# Patient Record
Sex: Female | Born: 1970 | Race: Black or African American | Hispanic: No | Marital: Single | Smoking: Former smoker
Health system: Southern US, Community
[De-identification: ages and names within clinical notes are randomized; demographics above are authoritative.]

## PROBLEM LIST (undated history)

## (undated) DIAGNOSIS — Z6721 Type B blood, Rh negative: Secondary | ICD-10-CM

## (undated) HISTORY — DX: Type B blood, Rh negative: Z67.21

## (undated) HISTORY — PX: BREAST EXCISIONAL BIOPSY: SUR124

---
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
## 1991-06-07 HISTORY — PX: LAPAROSCOPIC OVARIAN CYSTECTOMY: SUR786

## 1991-06-07 HISTORY — PX: BREAST BIOPSY: SHX20

## 1999-08-12 ENCOUNTER — Encounter: Payer: Self-pay | Admitting: Ophthalmology

## 1999-08-12 ENCOUNTER — Ambulatory Visit (HOSPITAL_COMMUNITY): Admission: RE | Admit: 1999-08-12 | Discharge: 1999-08-12 | Payer: Self-pay | Admitting: Ophthalmology

## 2000-02-03 ENCOUNTER — Other Ambulatory Visit: Admission: RE | Admit: 2000-02-03 | Discharge: 2000-02-03 | Payer: Self-pay | Admitting: Family Medicine

## 2000-07-31 ENCOUNTER — Other Ambulatory Visit: Admission: RE | Admit: 2000-07-31 | Discharge: 2000-07-31 | Payer: Self-pay | Admitting: Gynecology

## 2001-08-13 ENCOUNTER — Other Ambulatory Visit: Admission: RE | Admit: 2001-08-13 | Discharge: 2001-08-13 | Payer: Self-pay | Admitting: Gynecology

## 2003-02-26 ENCOUNTER — Other Ambulatory Visit: Admission: RE | Admit: 2003-02-26 | Discharge: 2003-02-26 | Payer: Self-pay | Admitting: Gynecology

## 2003-02-27 ENCOUNTER — Inpatient Hospital Stay (HOSPITAL_COMMUNITY): Admission: AD | Admit: 2003-02-27 | Discharge: 2003-03-02 | Payer: Self-pay | Admitting: Gynecology

## 2003-02-27 ENCOUNTER — Encounter (INDEPENDENT_AMBULATORY_CARE_PROVIDER_SITE_OTHER): Payer: Self-pay | Admitting: *Deleted

## 2003-04-03 ENCOUNTER — Encounter: Admission: RE | Admit: 2003-04-03 | Discharge: 2003-07-02 | Payer: Self-pay | Admitting: Gynecology

## 2004-12-26 ENCOUNTER — Emergency Department (HOSPITAL_COMMUNITY): Admission: EM | Admit: 2004-12-26 | Discharge: 2004-12-26 | Payer: Self-pay | Admitting: Emergency Medicine

## 2005-09-26 ENCOUNTER — Other Ambulatory Visit: Admission: RE | Admit: 2005-09-26 | Discharge: 2005-09-26 | Payer: Self-pay | Admitting: Gynecology

## 2006-09-28 ENCOUNTER — Other Ambulatory Visit: Admission: RE | Admit: 2006-09-28 | Discharge: 2006-09-28 | Payer: Self-pay | Admitting: Gynecology

## 2007-10-03 ENCOUNTER — Other Ambulatory Visit: Admission: RE | Admit: 2007-10-03 | Discharge: 2007-10-03 | Payer: Self-pay | Admitting: Gynecology

## 2008-05-16 ENCOUNTER — Emergency Department (HOSPITAL_COMMUNITY): Admission: EM | Admit: 2008-05-16 | Discharge: 2008-05-16 | Payer: Self-pay | Admitting: Emergency Medicine

## 2008-09-08 ENCOUNTER — Encounter: Payer: Self-pay | Admitting: Women's Health

## 2008-09-08 ENCOUNTER — Ambulatory Visit: Payer: Self-pay | Admitting: Women's Health

## 2008-09-08 ENCOUNTER — Other Ambulatory Visit: Admission: RE | Admit: 2008-09-08 | Discharge: 2008-09-08 | Payer: Self-pay | Admitting: Gynecology

## 2009-03-01 ENCOUNTER — Emergency Department (HOSPITAL_COMMUNITY): Admission: EM | Admit: 2009-03-01 | Discharge: 2009-03-01 | Payer: Self-pay | Admitting: Emergency Medicine

## 2009-11-09 ENCOUNTER — Other Ambulatory Visit: Admission: RE | Admit: 2009-11-09 | Discharge: 2009-11-09 | Payer: Self-pay | Admitting: Gynecology

## 2009-11-09 ENCOUNTER — Ambulatory Visit: Payer: Self-pay | Admitting: Women's Health

## 2010-03-08 ENCOUNTER — Ambulatory Visit: Payer: Self-pay | Admitting: Women's Health

## 2010-10-22 NOTE — Discharge Summary (Signed)
   [name], [name]                          ACCOUNT NO.:  [id number]   MEDICAL RECORD NO.:  [id number]                   PATIENT TYPE:  INP   LOCATION:  9307                                 [facility]ILITY:  [facility]   PHYSICIAN:  Juan H. Lily Peer, M.D.             DATE OF BIRTH:  Mar 29, 1971   DATE OF ADMISSION:  02/27/2003  DATE OF DISCHARGE:  03/02/2003                                 DISCHARGE SUMMARY   DISCHARGE DIAGNOSIS:  Ruptured ectopic, at approximately 9-2/7 weeks by  dates.   PROCEDURES:  1. Exploratory laparotomy.  2. Left salpingectomy.  3. Evacuation of hemoperitoneum.  4. Lysis of right pelvic adhesions.   HISTORY OF PRESENT ILLNESS:  A 40 year old gravida 1, para 0, with known LMP  December 22, 2002; with an [facility] of September 29, 2003.  She was at 9 weeks and 3 days  and presented to the office complaining of intense lower abdominal pain,  with no bleeding.  She could not stand upright at the office and was in  acute distress.  Ultrasound confirmed ectopic and she was admitted to the  hospital for surgical removal.   LABORATORY:  The patient is B negative.  Other prenatal laboratories were  not available.   HOSPITAL COURSE AND TREATMENT:  The patient presented on February 27, 2003  to the office with acute pain.  She was admitted to the hospital for an  ectopic pregnancy.  She had surgical removal of the ectopic pregnancy from  the left fallopian tube with an exploratory laparotomy.  Postoperatively she  did well.  She received RhoGAM, was able to void.  Vital signs stable.  She  was discharged to home in satisfactory condition on her postoperative day  #3.   DISCHARGE LABORATORIES:  White count 9.9, hemoglobin 10.6, hematocrit 31.2,  platelets 188,000.   DISPOSITION:  She was discharged to home with instructions to follow up in  two weeks at the office, or as needed.  Prescription for Tylox was given.  She is instructed to take iron and prenatal vitamins daily.     Susa Loffler, P.A.                    Juan H. Lily Peer, M.D.    TSG/MEDQ  D:  03/31/2003  T:  03/31/2003  Job:  [id number]

## 2010-10-22 NOTE — H&P (Signed)
NAMEHAYLA, [name]                          ACCOUNT NO.:  [id number]   MEDICAL RECORD NO.:  [id number]                   PATIENT TYPE:  MAT   LOCATION:  MATC                                 [facility]   PHYSICIAN:  Ivor Costa. Farrel Gobble, M.D.              DATE OF BIRTH:  1971-02-12   DATE OF ADMISSION:  02/27/2003  DATE OF DISCHARGE:                                HISTORY & PHYSICAL   CHIEF COMPLAINT:  Ruptured ectopic.   HISTORY OF PRESENT ILLNESS:  The patient is a 40 year old, G1, P0, with an  LMP of December 22, 2002, and an estimated date of confinement of September 29, 2003, who was at 9 weeks and 3 days.  She presented to the office today  complaining of intense lower abdominal pain, menstrual-like in nature.  No  vaginal bleeding.  The patient states that she felt the strong urge to have  a bowel movement today and shortly after pushing to have a bowel movement  she developed this pain.  She presented to the office.  She could not stand  upright.  She was in acute distress.  The patient was without any other  complaints.  Of note, she had her first prenatal visit yesterday.  The  patient denies any shortness of breath or syncope.   PAST OBSTETRICAL/GYNECOLOGICAL HISTORY:  The patient has regular menses.  The patient has a history of endometriosis for which she had a laparotomy in  1994.  She did not conceive on birth control pill.   PAST MEDICAL HISTORY:  Negative.   PAST SURGICAL HISTORY:  The patient had an appendectomy, cystectomy, and  exploratory laparotomy as two separate surgeries in 1994 and 1989,  respectively.   MEDICATIONS:  Prenatal vitamins.   ALLERGIES:  None.   SOCIAL HISTORY:  She smokes approximately one pack per day.  No alcohol or  caffeine.   PHYSICAL EXAMINATION:  GENERAL APPEARANCE:  She is an [place] American  female in moderate distress.  VITAL SIGNS:  Her blood pressure is 124/72.  HEART:  Regular rate.  LUNGS:  Clear to auscultation.  ABDOMEN:  Acutely tender.  PELVIC:  On speculum exam, there was some discharge in the vault.  Bimanual  examine was unable to be accomplished because of the patient's extreme  tenderness.  The patient then had a transvaginal ultrasound done in our  office, which she was unable to tolerate.  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  The endometrium was echogenic.  No intrauterine pregnancy was  seen.  The left adnexa had a 7 x 4 cm, thick-walled, cystic solid mass with  increased vascular flow.  There was a moderate amount of echogenic fluid in  the cul-de-sac that measured 5 x 3 cm.    ASSESSMENT:  Ruptured ectopic.  The patient will be transferred from  the  office to the OR for an exploratory laparotomy with a salpingectomy.  This  was explained to the patient and her fiancee.  All questions were addressed.                                               Ivor Costa. Farrel Gobble, M.D.    THL/MEDQ  D:  02/27/2003  T:  02/27/2003  Job:  [id number]

## 2010-10-22 NOTE — Op Note (Signed)
NAMEAREYA, LEMMERMAN                          ACCOUNT NO.:  [id number]   MEDICAL RECORD NO.:  [id number]                   PATIENT TYPE:  AMB   LOCATION:  SDC                                  [facility]  [facility]   PHYSICIAN:  Ivor Costa. Farrel Gobble, M.D.              DATE OF BIRTH:  02/18/1971   DATE OF PROCEDURE:  02/27/2003  DATE OF DISCHARGE:                                 OPERATIVE REPORT   PREOPERATIVE DIAGNOSES:  1. Ruptured ectopic.  2. Hemoperitoneum.   POSTOPERATIVE DIAGNOSES:  1. Ruptured ectopic.  2. Hemoperitoneum.   PROCEDURES:  1. Exploratory laparotomy.  2. Left salpingectomy.  3. Evacuation of hemoperitoneum.  4. Lysis of right pelvic adhesions.   SURGEON:  Ivor Costa. Farrel Gobble, M.D.   ANESTHESIA:  General.   FLUIDS REPLACED:  1200 mL of lactated Ringer's.   ESTIMATED BLOOD LOSS:  200 mL of clotted and 100 mL of nonclotted blood.   URINE OUTPUT:  300 mL clear urine.   FINDINGS:  There was a tortuous tube, markedly adherent to the cul-de-sac  and posterior uterus.  The left ovary was surgically absent.  The right tube  and ovary were adherent with filmy adhesions, which were lysed.  The right  ovary had evidence of corpus luteum cyst.   COMPLICATIONS:  None.   PATHOLOGY:  Left tube with ectopic.   DESCRIPTION OF PROCEDURE:  The patient was taken to the operating room and  general anesthesia was induced, placed in the dorsal lithotomy position,  prepped and draped in the usual sterile fashion with the legs in the frogleg  position.  A bivalve speculum was placed in the vagina.  The cervix was  visualized and a uterine manipulator was placed in order to help [place] with  mobility of uterus during the procedure.  The patient was prepped and draped  in the usual sterile fashion.  A Pfannenstiel skin incision was made with  the scalpel through the previous exploratory laparotomy site and carried  through the underlying layer of fascia with electrocautery.  The fascia  was  scored in the midline and the incision was extended laterally with the Mayo  scissors.  The inferior aspect of the fascial incision was grasped with  Kochers, underlying rectus muscles were dissected off by blunt and sharp  dissection.  In a similar fashion the superior aspect of the incision was  grasped with Kochers, the underlying rectus muscles were dissected off.  The  rectus muscles were separated in the midline.  The peritoneum was noted to  be markedly edematous and was eventually able to be entered bluntly, after  which a marked amount of clot was obtained.  After the clot was mostly  excised, we were able to extend the incision superiorly with careful  attention to underlying bowel.  The inferior peritoneum was gently dissected  down as it was markedly edematous and difficult to visualize.  The  pelvis  was inspected.  The ectopic on the left was able to be palpated but at this  point not yet visualized.  The right adnexa was also noted to have some  filmy adhesions in the posterior cul-de-sac, and these were bluntly  dissected off.  An O'Connor-O'Sullivan retractor was then placed in the  incision, moist lap packs were placed.  The round ligament was grasped  bilaterally and the uterus was able to be better rotated at that point.  A  tortuous structure which was later identified as the tube was identified and  markedly adherent to the posterior cul-de-sac and uterus.  The adhesions  were bluntly dissected off until this could be better visualized and brought  to the incision.  A Kelly clamp was then placed across the midportion of  this tube, but at this point we had not identified the ovary.  Prior to  dissecting off the remainder of the tube, we extensively looked for the  ovary.  We were aware that the patient had had a cystectomy in the past for  an endometrioma.  After multiple attempts to find the ovary, we were  unsuccessful.  The IP was clamped with the fimbriated  edge of the tube and  the specimen was sharply dissected off.  The mesosalpinx was then closed  with Heaney suture of 0 Vicryl, and it was noted to be hemostatic  afterwards.  Inspection of the adnexa confirmed the absence of the ovary.  Inspection of the pelvis also confirmed the absence of the ovary.  The IP  was clearly identified.  The pelvis was then irrigated with copious amounts  of warm saline.  Hemostasis on the left side was assured.  Inspection of the  right tube and ovary then ensued.  The fimbriated end was noted to be  edematous secondary to the hemoperitoneum, however not grossly obstructed.  The right ovary and tube were gently lysed and mobile at the end of the  case.  The retractor and sponges were then removed.  The pelvis was  irrigated.  Hemostasis of the fascia and muscle was assured.  The fascia was  closed with 0 Vicryl in a running fashion, the subcu was irrigated, and the  skin was closed with 4-0 Vicryl on a [place].  The patient tolerated the  procedure well.  Sponge, lap, and needle counts were correct x2.  She was  transferred to the PACU in stable condition.  She received Ancef  intraoperatively.                                               Ivor Costa. Farrel Gobble, M.D.    THL/MEDQ  D:  02/27/2003  T:  02/28/2003  Job:  [id number]

## 2010-11-11 ENCOUNTER — Encounter: Payer: Self-pay | Admitting: Women's Health

## 2010-11-24 ENCOUNTER — Other Ambulatory Visit: Payer: Self-pay | Admitting: Women's Health

## 2010-11-24 ENCOUNTER — Other Ambulatory Visit (HOSPITAL_COMMUNITY)
Admission: RE | Admit: 2010-11-24 | Discharge: 2010-11-24 | Disposition: A | Discharge status: Home / Self Care | Payer: 59 | Source: Ambulatory Visit | Attending: Gynecology | Admitting: Gynecology

## 2010-11-24 ENCOUNTER — Encounter (INDEPENDENT_AMBULATORY_CARE_PROVIDER_SITE_OTHER): Payer: 59 | Admitting: Women's Health

## 2010-11-24 DIAGNOSIS — R809 Proteinuria, unspecified: Secondary | ICD-10-CM

## 2010-11-24 DIAGNOSIS — Z124 Encounter for screening for malignant neoplasm of cervix: Secondary | ICD-10-CM | POA: Insufficient documentation

## 2010-11-24 DIAGNOSIS — Z01419 Encounter for gynecological examination (general) (routine) without abnormal findings: Secondary | ICD-10-CM

## 2010-11-24 DIAGNOSIS — Z13220 Encounter for screening for lipoid disorders: Secondary | ICD-10-CM

## 2010-12-01 ENCOUNTER — Other Ambulatory Visit: Payer: 59

## 2010-12-01 ENCOUNTER — Ambulatory Visit (INDEPENDENT_AMBULATORY_CARE_PROVIDER_SITE_OTHER): Payer: 59 | Admitting: Women's Health

## 2010-12-01 DIAGNOSIS — N831 Corpus luteum cyst of ovary, unspecified side: Secondary | ICD-10-CM

## 2010-12-01 DIAGNOSIS — N946 Dysmenorrhea, unspecified: Secondary | ICD-10-CM

## 2010-12-01 DIAGNOSIS — IMO0002 Reserved for concepts with insufficient information to code with codable children: Secondary | ICD-10-CM

## 2010-12-01 DIAGNOSIS — N920 Excessive and frequent menstruation with regular cycle: Secondary | ICD-10-CM

## 2011-12-22 ENCOUNTER — Encounter: Payer: 59 | Admitting: Women's Health

## 2011-12-29 ENCOUNTER — Ambulatory Visit (INDEPENDENT_AMBULATORY_CARE_PROVIDER_SITE_OTHER): Payer: 59 | Admitting: Women's Health

## 2011-12-29 ENCOUNTER — Encounter: Payer: Self-pay | Admitting: Women's Health

## 2011-12-29 VITALS — BP 128/80 | Ht 64.5 in | Wt 228.0 lb

## 2011-12-29 DIAGNOSIS — E079 Disorder of thyroid, unspecified: Secondary | ICD-10-CM

## 2011-12-29 DIAGNOSIS — Z01419 Encounter for gynecological examination (general) (routine) without abnormal findings: Secondary | ICD-10-CM

## 2011-12-29 DIAGNOSIS — Z13220 Encounter for screening for lipoid disorders: Secondary | ICD-10-CM

## 2011-12-29 DIAGNOSIS — D219 Benign neoplasm of connective and other soft tissue, unspecified: Secondary | ICD-10-CM | POA: Insufficient documentation

## 2011-12-29 DIAGNOSIS — D259 Leiomyoma of uterus, unspecified: Secondary | ICD-10-CM

## 2011-12-29 DIAGNOSIS — E119 Type 2 diabetes mellitus without complications: Secondary | ICD-10-CM

## 2011-12-29 LAB — LIPID PANEL
Cholesterol: 145 mg/dL (ref 0–200)
HDL: 41 mg/dL (ref >39)
LDL Cholesterol: 90 mg/dL (ref 0–99)
Total CHOL/HDL Ratio: 3.5 Ratio
Triglycerides: 69 mg/dL (ref <150)
VLDL: 14 mg/dL (ref 0–40)

## 2011-12-29 LAB — CBC WITH DIFFERENTIAL/PLATELET
Basophils Absolute: 0 10*3/uL (ref 0.0–0.1)
Basophils Relative: 0 % (ref 0–1)
Eosinophils Absolute: 0.1 10*3/uL (ref 0.0–0.7)
Eosinophils Relative: 1 % (ref 0–5)
HCT: 40.1 % (ref 36.0–46.0)
Hemoglobin: 13.6 g/dL (ref 12.0–15.0)
Lymphocytes Relative: 32 % (ref 12–46)
Lymphs Abs: 1.5 10*3/uL (ref 0.7–4.0)
MCH: 29.9 pg (ref 26.0–34.0)
MCHC: 33.9 g/dL (ref 30.0–36.0)
MCV: 88.1 fL (ref 78.0–100.0)
Monocytes Absolute: 0.3 10*3/uL (ref 0.1–1.0)
Monocytes Relative: 6 % (ref 3–12)
Neutro Abs: 2.8 10*3/uL (ref 1.7–7.7)
Neutrophils Relative %: 61 % (ref 43–77)
Platelets: 205 10*3/uL (ref 150–400)
RBC: 4.55 MIL/uL (ref 3.87–5.11)
RDW: 13.5 % (ref 11.5–15.5)
WBC: 4.7 10*3/uL (ref 4.0–10.5)

## 2011-12-29 LAB — TSH: TSH: 1.574 u[IU]/mL (ref 0.350–4.500)

## 2011-12-29 LAB — GLUCOSE, RANDOM: Glucose, Bld: 116 mg/dL — ABNORMAL HIGH (ref 70–99)

## 2011-12-29 LAB — HEMOGLOBIN A1C
Hgb A1c MFr Bld: 6.3 % — ABNORMAL HIGH (ref <5.7)
Mean Plasma Glucose: 134 mg/dL — ABNORMAL HIGH (ref <117)

## 2011-12-29 NOTE — Patient Instructions (Addendum)

## 2011-12-29 NOTE — Progress Notes (Signed)
[name] 1970-12-06 [id number]    History:    The patient presents for annual exam.  Monthly cycles lasting 3-5 days/not sexually active/not desiring conception. Breast Biopsy 93-benign/Mammogram for suspicious lump 08-normal. Hx normal Paps. Hx fibroids-asymptomatic. Hx type II DM-diet controlled, has seen Dr. Lisabeth Devoid the past...    Past medical history, past surgical history, family history and social history were all reviewed and documented in the EPIC chart. Works at [employer].    ROS:  A  ROS was performed and pertinent positives and negatives are included in the history.  Exam:  Filed Vitals:   12/29/11 0819  BP: 128/80    General appearance:  Normal Head/Neck:  Normal, without cervical or supraclavicular adenopathy. Thyroid:  Symmetrical, normal in size, without palpable masses or nodularity. Respiratory  Effort:  Normal  Auscultation:  Clear without wheezing or rhonchi Cardiovascular  Auscultation:  Regular rate, without rubs, murmurs or gallops  Edema/varicosities:  Not grossly evident Abdominal  Soft,nontender, without masses, guarding or rebound.  Liver/spleen:  No organomegaly noted  Hernia:  None appreciated  Skin  Inspection:  Grossly normal  Palpation:  Grossly normal Neurologic/psychiatric  Orientation:  Normal with appropriate conversation.  Mood/affect:  Normal  Genitourinary    Breasts: Examined lying and sitting.     Right: Without masses, retractions, discharge or axillary adenopathy.     Left: Without masses, retractions, discharge or axillary adenopathy.   Inguinal/mons:  Normal without inguinal adenopathy  External genitalia:  Normal  BUS/Urethra/Skene's glands:  Normal  Bladder:  Normal  Vagina:  Normal  Cervix:  Normal  Uterus:   Bulky in size, shape and contour.  Midline and mobile  Adnexa/parametria:     Rt: Without masses or tenderness.   Lt: Without masses or tenderness.  Anus and perineum: Normal  Digital rectal exam: Normal  sphincter tone without palpated masses or tenderness  Assessment/Plan:  41 y.o.  SBF G1P0 for annual exam with no complaints.  Normal gyn exam/ fibroids Hx type II DM-diet controlled Obesity  Plan: U/A, CBC, TSH, Hbg AIC, lipid panel, Glu. No Pap, new screening guidelines reviewed. Instructed to scheduled mammogram and continue with yearly mammograms. SBE's, Ca rich diet, Vit. D 1000 daily, exercise, cutting calories & carbs for weight loss encouraged. Risk factors associated with obesity reviewed. Condoms encouraged if become sexually active.    Harrington Challenger [facility], 8:52 AM 12/29/2011

## 2011-12-30 LAB — URINALYSIS W MICROSCOPIC + REFLEX CULTURE
Bacteria, UA: NONE SEEN
Bilirubin Urine: NEGATIVE
Casts: NONE SEEN
Crystals: NONE SEEN
Glucose, UA: NEGATIVE mg/dL
Hgb urine dipstick: NEGATIVE
Ketones, ur: NEGATIVE mg/dL
Leukocytes, UA: NEGATIVE
Nitrite: NEGATIVE
Protein, ur: NEGATIVE mg/dL
Specific Gravity, Urine: 1.024 (ref 1.005–1.030)
Squamous Epithelial / HPF: NONE SEEN
Urobilinogen, UA: 0.2 mg/dL (ref 0.0–1.0)
pH: 5 (ref 5.0–8.0)

## 2012-02-16 ENCOUNTER — Other Ambulatory Visit: Payer: Self-pay | Admitting: Women's Health

## 2012-02-16 DIAGNOSIS — Z1231 Encounter for screening mammogram for malignant neoplasm of breast: Secondary | ICD-10-CM

## 2012-03-06 ENCOUNTER — Ambulatory Visit (HOSPITAL_COMMUNITY)
Admission: RE | Admit: 2012-03-06 | Discharge: 2012-03-06 | Disposition: A | Discharge status: Home / Self Care | Payer: 59 | Source: Ambulatory Visit | Attending: Women's Health | Admitting: Women's Health

## 2012-03-06 DIAGNOSIS — Z1231 Encounter for screening mammogram for malignant neoplasm of breast: Secondary | ICD-10-CM | POA: Insufficient documentation

## 2012-03-08 ENCOUNTER — Other Ambulatory Visit: Payer: Self-pay | Admitting: Women's Health

## 2012-03-08 DIAGNOSIS — R928 Other abnormal and inconclusive findings on diagnostic imaging of breast: Secondary | ICD-10-CM

## 2012-03-09 ENCOUNTER — Other Ambulatory Visit: Payer: Self-pay | Admitting: *Deleted

## 2012-03-09 DIAGNOSIS — N63 Unspecified lump in unspecified breast: Secondary | ICD-10-CM

## 2012-03-13 ENCOUNTER — Ambulatory Visit
Admission: RE | Admit: 2012-03-13 | Discharge: 2012-03-13 | Disposition: A | Discharge status: Home / Self Care | Payer: 59 | Source: Ambulatory Visit | Attending: Women's Health | Admitting: Women's Health

## 2012-03-13 DIAGNOSIS — R928 Other abnormal and inconclusive findings on diagnostic imaging of breast: Secondary | ICD-10-CM

## 2012-03-30 ENCOUNTER — Other Ambulatory Visit: Payer: Self-pay | Admitting: *Deleted

## 2012-03-30 DIAGNOSIS — N63 Unspecified lump in unspecified breast: Secondary | ICD-10-CM

## 2012-04-05 ENCOUNTER — Telehealth: Payer: Self-pay | Admitting: Women's Health

## 2012-04-05 NOTE — Telephone Encounter (Signed)
Telephone call for followup. Had a mammogram that described breast is extremely dense, questionable nodule with normal ultrasound. No further followup was recommended other than annual mammogram and continue SBE's and report changes.

## 2012-04-30 ENCOUNTER — Other Ambulatory Visit: Payer: Self-pay | Admitting: *Deleted

## 2012-04-30 DIAGNOSIS — N63 Unspecified lump in unspecified breast: Secondary | ICD-10-CM

## 2012-12-31 ENCOUNTER — Ambulatory Visit (INDEPENDENT_AMBULATORY_CARE_PROVIDER_SITE_OTHER): Payer: 59 | Admitting: Women's Health

## 2012-12-31 ENCOUNTER — Other Ambulatory Visit (HOSPITAL_COMMUNITY)
Admission: RE | Admit: 2012-12-31 | Discharge: 2012-12-31 | Disposition: A | Discharge status: Home / Self Care | Payer: 59 | Source: Ambulatory Visit | Attending: Gynecology | Admitting: Gynecology

## 2012-12-31 ENCOUNTER — Encounter: Payer: Self-pay | Admitting: Women's Health

## 2012-12-31 VITALS — BP 110/70 | Ht 65.0 in | Wt 221.0 lb

## 2012-12-31 DIAGNOSIS — Z13220 Encounter for screening for lipoid disorders: Secondary | ICD-10-CM

## 2012-12-31 DIAGNOSIS — Z01419 Encounter for gynecological examination (general) (routine) without abnormal findings: Secondary | ICD-10-CM

## 2012-12-31 DIAGNOSIS — Z833 Family history of diabetes mellitus: Secondary | ICD-10-CM

## 2012-12-31 LAB — LIPID PANEL
HDL: 35 mg/dL — ABNORMAL LOW (ref >39)
LDL Cholesterol: 86 mg/dL (ref 0–99)
Total CHOL/HDL Ratio: 3.7 Ratio
Triglycerides: 51 mg/dL (ref <150)
VLDL: 10 mg/dL (ref 0–40)

## 2012-12-31 LAB — CBC WITH DIFFERENTIAL/PLATELET
Basophils Absolute: 0 10*3/uL (ref 0.0–0.1)
HCT: 39.7 % (ref 36.0–46.0)
Hemoglobin: 13.2 g/dL (ref 12.0–15.0)
Lymphocytes Relative: 37 % (ref 12–46)
Monocytes Absolute: 0.3 10*3/uL (ref 0.1–1.0)
Monocytes Relative: 6 % (ref 3–12)
Neutro Abs: 2.5 10*3/uL (ref 1.7–7.7)
Neutrophils Relative %: 56 % (ref 43–77)
RDW: 13.6 % (ref 11.5–15.5)
WBC: 4.6 10*3/uL (ref 4.0–10.5)

## 2012-12-31 LAB — HEMOGLOBIN A1C: Hgb A1c MFr Bld: 5.8 % — ABNORMAL HIGH (ref <5.7)

## 2012-12-31 NOTE — Progress Notes (Signed)
Quida Kolasinski 04-21-1971 [id number]    History:    The patient presents for annual exam.  Monthly cycle first 2 days are heavy/condoms/fibroids-5 cm anterior.  New partner/negative STD screen. History of normal Paps. Benign breast cysts 1993. Ovarian cystectomy 1993. Diabetes 2008, diet has not been back to Dr Talmage Nap since 2012. Paternal cousin breast cancer.  Past medical history, past surgical history, family history and social history were all reviewed and documented in the EPIC chart. Works at [employer]. Mother hypertension, father alcohol died of kidney disease. Maternal grandfather diabetes.    ROS:  A  ROS was performed and pertinent positives and negatives are included in the history.  Exam:  Filed Vitals:   12/31/12 0848  BP: 110/70    General appearance:  Normal Head/Neck:  Normal, without cervical or supraclavicular adenopathy. Thyroid:  Symmetrical, normal in size, without palpable masses or nodularity. Respiratory  Effort:  Normal  Auscultation:  Clear without wheezing or rhonchi Cardiovascular  Auscultation:  Regular rate, without rubs, murmurs or gallops  Edema/varicosities:  Not grossly evident Abdominal  Soft,nontender, without masses, guarding or rebound.  Liver/spleen:  No organomegaly noted  Hernia:  None appreciated  Skin  Inspection:  Grossly normal  Palpation:  Grossly normal Neurologic/psychiatric  Orientation:  Normal with appropriate conversation.  Mood/affect:  Normal  Genitourinary    Breasts: Examined lying and sitting.     Right: Without masses, retractions, discharge or axillary adenopathy.     Left: Without masses, retractions, discharge or axillary adenopathy.   Inguinal/mons:  Normal without inguinal adenopathy  External genitalia:  Normal  BUS/Urethra/Skene's glands:  Normal  Bladder:  Normal  Vagina:  Normal  Cervix:  Normal  Uterus:  Bulky/fibroids shape and contour.  Midline and mobile  Adnexa/parametria:     Rt: Without masses or  tenderness.   Lt: Without masses or tenderness.  Anus and perineum: Normal  Digital rectal exam: Normal sphincter tone without palpated masses or tenderness  Assessment/Plan:  42 y.o. SBF G1 P0 for annual exam with no complaints.  Fibroid uterus/monthly cycle first 2 days heavy Diabetes-diet obesity  Plan: CBC, hemoglobin A1c, lipid panel, UA. Pap, Pap normal 2012, new screening guidelines reviewed. SBE's, continue annual mammogram, continue to increase regular exercise and decrease calories for continued weight loss. Vitamin D 1000 daily encouraged. Contraception reviewed, will continue condoms.Marland Kitchen   Harrington Challenger [facility], 9:14 AM 12/31/2012

## 2012-12-31 NOTE — Addendum Note (Signed)
Addended by: Richardson Chiquito on: 12/31/2012 09:39 AM   Modules accepted: Orders

## 2012-12-31 NOTE — Patient Instructions (Addendum)

## 2013-01-01 ENCOUNTER — Encounter: Payer: Self-pay | Admitting: Women's Health

## 2013-01-01 LAB — URINALYSIS W MICROSCOPIC + REFLEX CULTURE
Bilirubin Urine: NEGATIVE
Crystals: NONE SEEN
Leukocytes, UA: NEGATIVE
Protein, ur: NEGATIVE mg/dL
Specific Gravity, Urine: >1.03 — ABNORMAL HIGH (ref 1.005–1.030)
Squamous Epithelial / LPF: NONE SEEN
Urobilinogen, UA: 0.2 mg/dL (ref 0.0–1.0)

## 2013-01-07 ENCOUNTER — Encounter: Payer: Self-pay | Admitting: Women's Health

## 2013-02-14 ENCOUNTER — Other Ambulatory Visit: Payer: Self-pay | Admitting: Women's Health

## 2013-02-14 DIAGNOSIS — Z1231 Encounter for screening mammogram for malignant neoplasm of breast: Secondary | ICD-10-CM

## 2013-03-14 ENCOUNTER — Ambulatory Visit (HOSPITAL_COMMUNITY)
Admission: RE | Admit: 2013-03-14 | Discharge: 2013-03-14 | Disposition: A | Discharge status: Home / Self Care | Payer: 59 | Source: Ambulatory Visit | Attending: Women's Health | Admitting: Women's Health

## 2013-03-14 DIAGNOSIS — Z1231 Encounter for screening mammogram for malignant neoplasm of breast: Secondary | ICD-10-CM | POA: Insufficient documentation

## 2013-03-19 ENCOUNTER — Other Ambulatory Visit: Payer: Self-pay | Admitting: Women's Health

## 2013-03-19 DIAGNOSIS — R928 Other abnormal and inconclusive findings on diagnostic imaging of breast: Secondary | ICD-10-CM

## 2013-04-03 ENCOUNTER — Ambulatory Visit
Admission: RE | Admit: 2013-04-03 | Discharge: 2013-04-03 | Disposition: A | Discharge status: Home / Self Care | Payer: 59 | Source: Ambulatory Visit | Attending: Women's Health | Admitting: Women's Health

## 2013-04-03 DIAGNOSIS — R928 Other abnormal and inconclusive findings on diagnostic imaging of breast: Secondary | ICD-10-CM

## 2013-07-02 ENCOUNTER — Ambulatory Visit (INDEPENDENT_AMBULATORY_CARE_PROVIDER_SITE_OTHER): Payer: 59 | Admitting: Women's Health

## 2013-07-02 ENCOUNTER — Encounter: Payer: Self-pay | Admitting: Women's Health

## 2013-07-02 DIAGNOSIS — N760 Acute vaginitis: Secondary | ICD-10-CM

## 2013-07-02 DIAGNOSIS — N898 Other specified noninflammatory disorders of vagina: Secondary | ICD-10-CM

## 2013-07-02 DIAGNOSIS — A499 Bacterial infection, unspecified: Secondary | ICD-10-CM

## 2013-07-02 DIAGNOSIS — B9689 Other specified bacterial agents as the cause of diseases classified elsewhere: Secondary | ICD-10-CM

## 2013-07-02 DIAGNOSIS — D259 Leiomyoma of uterus, unspecified: Secondary | ICD-10-CM

## 2013-07-02 DIAGNOSIS — D219 Benign neoplasm of connective and other soft tissue, unspecified: Secondary | ICD-10-CM

## 2013-07-02 DIAGNOSIS — E119 Type 2 diabetes mellitus without complications: Secondary | ICD-10-CM

## 2013-07-02 LAB — WET PREP FOR TRICH, YEAST, CLUE: Trich, Wet Prep: NONE SEEN

## 2013-07-02 MED ORDER — FLUCONAZOLE 150 MG PO TABS: 150.0000 mg | ORAL_TABLET | Freq: Once | ORAL | Status: DC

## 2013-07-02 MED ORDER — METRONIDAZOLE 0.75 % VA GEL: VAGINAL | Status: DC

## 2013-07-02 NOTE — Progress Notes (Signed)
Patient ID: [name], female   DOB: Mar 06, 1971, 43 y.o.   MRN: [id number] Presents with complaint of a small bump that has resolved in the right groin and a tender area on the left perineal body. Slight white discharge without odor, minimal itching. Same partner with negative cultures. Denies abdominal pain, blisters or fever. Monthly cycle/condoms.  Exam: Appears well. External genitalia within normal limits, no visible erythema, folliculitis, blisters. Speculum exam moderate amount of a white discharge without odor, wet prep positive for yeast, clues, TNTC bacteria. Bimanual no CMT or tenderness, fibroid uterus.  Yeast vaginitis and bacteria vaginosis Fibroids  Plan: MetroGel vaginal cream 1 applicator at bedtime x5, alcohol precautions reviewed. Diflucan 150 by mouth times one dose. Instructed to call if further problems.

## 2013-07-02 NOTE — Patient Instructions (Signed)
Monilial Vaginitis Vaginitis in a soreness, swelling and redness (inflammation) of the vagina and vulva. Monilial vaginitis is not a sexually transmitted infection. CAUSES  Yeast vaginitis is caused by yeast (candida) that is normally found in your vagina. With a yeast infection, the candida has overgrown in number to a point that upsets the chemical balance. SYMPTOMS   White, thick vaginal discharge.  Swelling, itching, redness and irritation of the vagina and possibly the lips of the vagina (vulva).  Burning or painful urination.  Painful intercourse. DIAGNOSIS  Things that may contribute to monilial vaginitis are:  Postmenopausal and virginal states.  Pregnancy.  Infections.  Being tired, sick or stressed, especially if you had monilial vaginitis in the past.  Diabetes. Good control will help lower the chance.  Birth control pills.  Tight fitting garments.  Using bubble bath, feminine sprays, douches or deodorant tampons.  Taking certain medications that kill germs (antibiotics).  Sporadic recurrence can occur if you become ill. TREATMENT  Your caregiver will give you medication.  There are several kinds of anti monilial vaginal creams and suppositories specific for monilial vaginitis. For recurrent yeast infections, use a suppository or cream in the vagina 2 times a week, or as directed.  Anti-monilial or steroid cream for the itching or irritation of the vulva may also be used. Get your caregiver's permission.  Painting the vagina with methylene blue solution may help if the monilial cream does not work.  Eating yogurt may help prevent monilial vaginitis. HOME CARE INSTRUCTIONS   Finish all medication as prescribed.  Do not have sex until treatment is completed or after your caregiver tells you it is okay.  Take warm sitz baths.  Do not douche.  Do not use tampons, especially scented ones.  Wear cotton underwear.  Avoid tight pants and panty  hose.  Tell your sexual partner that you have a yeast infection. They should go to their caregiver if they have symptoms such as mild rash or itching.  Your sexual partner should be treated as well if your infection is difficult to eliminate.  Practice safer sex. Use condoms.  Some vaginal medications cause latex condoms to fail. Vaginal medications that harm condoms are:  Cleocin cream.  Butoconazole (Femstat).  Terconazole (Terazol) vaginal suppository.  Miconazole (Monistat) (may be purchased over the counter). SEEK MEDICAL CARE IF:   You have a temperature by mouth above 102 F (38.9 C).  The infection is getting worse after 2 days of treatment.  The infection is not getting better after 3 days of treatment.  You develop blisters in or around your vagina.  You develop vaginal bleeding, and it is not your menstrual period.  You have pain when you urinate.  You develop intestinal problems.  You have pain with sexual intercourse. Document Released: 03/02/2005 Document Revised: 08/15/2011 Document Reviewed: 11/14/2008 [facility] Patient Information 2014 [place], [place]. Bacterial Vaginosis Bacterial vaginosis is an infection of the vagina. It happens when too many of certain germs (bacteria) grow in the vagina. HOME CARE  Take your medicine as told by your doctor.  Finish your medicine even if you start to feel better.  Do not have sex until you finish your medicine and are better.  Tell your sex partner that you have an infection. They should see their doctor for treatment.  Practice safe sex. Use condoms. Have only one sex partner. GET HELP IF:  You are not getting better after 3 days of treatment.  You have more grey fluid (discharge) coming  from your vagina than before.  You have more pain than before.  You have a fever. MAKE SURE YOU:   Understand these instructions.  Will watch your condition.  Will get help right away if you are not doing well  or get worse. Document Released: 03/01/2008 Document Revised: 03/13/2013 Document Reviewed: 01/02/2013 [facility]  Patient Information 2014 [place].

## 2014-01-08 ENCOUNTER — Ambulatory Visit (INDEPENDENT_AMBULATORY_CARE_PROVIDER_SITE_OTHER): Payer: 59 | Admitting: Women's Health

## 2014-01-08 ENCOUNTER — Encounter: Payer: Self-pay | Admitting: Women's Health

## 2014-01-08 VITALS — BP 116/74 | Ht 65.0 in | Wt 222.0 lb

## 2014-01-08 DIAGNOSIS — Z13220 Encounter for screening for lipoid disorders: Secondary | ICD-10-CM

## 2014-01-08 DIAGNOSIS — Z01419 Encounter for gynecological examination (general) (routine) without abnormal findings: Secondary | ICD-10-CM

## 2014-01-08 DIAGNOSIS — Z833 Family history of diabetes mellitus: Secondary | ICD-10-CM

## 2014-01-08 LAB — CBC WITH DIFFERENTIAL/PLATELET
Basophils Absolute: 0 10*3/uL (ref 0.0–0.1)
Basophils Relative: 0 % (ref 0–1)
Eosinophils Absolute: 0 10*3/uL (ref 0.0–0.7)
Eosinophils Relative: 1 % (ref 0–5)
HCT: 39.8 % (ref 36.0–46.0)
Hemoglobin: 13.1 g/dL (ref 12.0–15.0)
LYMPHS ABS: 1.7 10*3/uL (ref 0.7–4.0)
LYMPHS PCT: 37 % (ref 12–46)
MCH: 29.3 pg (ref 26.0–34.0)
MCHC: 32.9 g/dL (ref 30.0–36.0)
MCV: 89 fL (ref 78.0–100.0)
Monocytes Absolute: 0.3 10*3/uL (ref 0.1–1.0)
Monocytes Relative: 6 % (ref 3–12)
NEUTROS PCT: 56 % (ref 43–77)
Neutro Abs: 2.5 10*3/uL (ref 1.7–7.7)
Platelets: 197 10*3/uL (ref 150–400)
RBC: 4.47 MIL/uL (ref 3.87–5.11)
RDW: 13.8 % (ref 11.5–15.5)
WBC: 4.5 10*3/uL (ref 4.0–10.5)

## 2014-01-08 LAB — LIPID PANEL
CHOL/HDL RATIO: 3.1 ratio
CHOLESTEROL: 134 mg/dL (ref 0–200)
HDL: 43 mg/dL (ref >39)
LDL Cholesterol: 80 mg/dL (ref 0–99)
TRIGLYCERIDES: 54 mg/dL (ref <150)
VLDL: 11 mg/dL (ref 0–40)

## 2014-01-08 LAB — URINALYSIS W MICROSCOPIC + REFLEX CULTURE
BACTERIA UA: NONE SEEN
BILIRUBIN URINE: NEGATIVE
CRYSTALS: NONE SEEN
Casts: NONE SEEN
Glucose, UA: NEGATIVE mg/dL
KETONES UR: NEGATIVE mg/dL
Leukocytes, UA: NEGATIVE
Nitrite: NEGATIVE
Protein, ur: NEGATIVE mg/dL
SPECIFIC GRAVITY, URINE: 1.021 (ref 1.005–1.030)
Squamous Epithelial / LPF: NONE SEEN
Urobilinogen, UA: 0.2 mg/dL (ref 0.0–1.0)
pH: 5 (ref 5.0–8.0)

## 2014-01-08 LAB — HEMOGLOBIN A1C
Hgb A1c MFr Bld: 6.3 % — ABNORMAL HIGH (ref <5.7)
Mean Plasma Glucose: 134 mg/dL — ABNORMAL HIGH (ref <117)

## 2014-01-08 NOTE — Progress Notes (Signed)
[name] 01/27/1971 [id number]    History:    Presents for annual exam.  Regular monthly 4-5 day cycle first 2 days heavy/condoms/same partner. 5 cm fundal fibroid. Normal Pap history. 1993 benign breast cyst, normal mammograms. 1993 cystectomy. History of elevated blood sugar, endocrinologist recommended increased exercise low sugar diet, last hemoglobin A1c 5.8.  Past medical history, past surgical history, family history and social history were all reviewed and documented in the EPIC chart. Works for [employer]. Mother hypertension.  ROS:  A  12 point ROS was performed and pertinent positives and negatives are included.  Exam:  Filed Vitals:   01/08/14 0843  BP: 116/74    General appearance:  Normal Thyroid:  Symmetrical, normal in size, without palpable masses or nodularity. Respiratory  Auscultation:  Clear without wheezing or rhonchi Cardiovascular  Auscultation:  Regular rate, without rubs, murmurs or gallops  Edema/varicosities:  Not grossly evident Abdominal  Soft,nontender, without masses, guarding or rebound.  Liver/spleen:  No organomegaly noted  Hernia:  None appreciated  Skin  Inspection:  Grossly normal   Breasts: Examined lying and sitting.     Right: Without masses, retractions, discharge or axillary adenopathy.     Left: Without masses, retractions, discharge or axillary adenopathy. Gentitourinary   Inguinal/mons:  Normal without inguinal adenopathy  External genitalia:  Normal  BUS/Urethra/Skene's glands:  Normal  Vagina:  Normal  Cervix:  Normal  Uterus:  Enlarged, lobular/fibroids shape and contour.  Midline and mobile  Adnexa/parametria:     Rt: Without masses or tenderness.   Lt: Without masses or tenderness.  Anus and perineum: Normal  Digital rectal exam: Normal sphincter tone without palpated masses or tenderness  Assessment/Plan:  43 y.o. SBF G1P0 for annual exam.    Fibroid uterus/first 2 days of cycle heavy Obesity Diabetes-diet  only  Plan: Contraception options reviewed and declined will continue condoms. Pregnancy okay if occurs. SBE's, continue annual mammogram, calcium rich diet, MVI daily encouraged. Increase regular exercise and decrease calories for weight loss. CBC, hemoglobin A1c, lipid panel, UA, Pap normal 2014, new screening guidelines reviewed. Discussed myomectomy if chooses to pursue pregnancy, declines at this time.   Note: This dictation was prepared with Dragon/digital dictation.  Any transcriptional errors that result are unintentional. Huel [facility], 9:59 AM 01/08/2014

## 2014-01-08 NOTE — Patient Instructions (Signed)

## 2014-01-09 ENCOUNTER — Other Ambulatory Visit: Payer: Self-pay | Admitting: Women's Health

## 2014-01-09 DIAGNOSIS — R7309 Other abnormal glucose: Secondary | ICD-10-CM

## 2014-03-07 ENCOUNTER — Other Ambulatory Visit: Payer: Self-pay | Admitting: Women's Health

## 2014-03-07 DIAGNOSIS — Z1231 Encounter for screening mammogram for malignant neoplasm of breast: Secondary | ICD-10-CM

## 2014-03-29 ENCOUNTER — Emergency Department (HOSPITAL_COMMUNITY)
Admission: EM | Admit: 2014-03-29 | Discharge: 2014-03-29 | Disposition: A | Discharge status: Home / Self Care | Payer: 59 | Attending: Emergency Medicine | Admitting: Emergency Medicine

## 2014-03-29 ENCOUNTER — Encounter (HOSPITAL_COMMUNITY): Payer: Self-pay | Admitting: Emergency Medicine

## 2014-03-29 DIAGNOSIS — E119 Type 2 diabetes mellitus without complications: Secondary | ICD-10-CM | POA: Diagnosis not present

## 2014-03-29 DIAGNOSIS — Z87891 Personal history of nicotine dependence: Secondary | ICD-10-CM | POA: Insufficient documentation

## 2014-03-29 DIAGNOSIS — S01511A Laceration without foreign body of lip, initial encounter: Secondary | ICD-10-CM | POA: Insufficient documentation

## 2014-03-29 DIAGNOSIS — W500XXA Accidental hit or strike by another person, initial encounter: Secondary | ICD-10-CM | POA: Diagnosis not present

## 2014-03-29 DIAGNOSIS — Y9289 Other specified places as the place of occurrence of the external cause: Secondary | ICD-10-CM | POA: Insufficient documentation

## 2014-03-29 DIAGNOSIS — Y9389 Activity, other specified: Secondary | ICD-10-CM | POA: Insufficient documentation

## 2014-03-29 MED ORDER — LIDOCAINE HCL (PF) 1 % IJ SOLN
5.0000 mL | Freq: Once | INTRAMUSCULAR | Status: AC
  Administered 2014-03-29: 5 mL
  Filled 2014-03-29: qty 5
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
## 2014-03-29 NOTE — ED Notes (Signed)
MD at bedside. 

## 2014-03-29 NOTE — Discharge Instructions (Signed)
Soft diet, rinse regularly. Your sutures are absorbable. Return for signs of infection.  If you were given medicines take as directed.  If you are on coumadin or contraceptives realize their levels and effectiveness is altered by many different medicines.  If you have any reaction (rash, tongues swelling, other) to the medicines stop taking and see a physician.   Please follow up as directed and return to the ER or see a physician for new or worsening symptoms.  Thank you. Filed Vitals:   03/29/14 0400 03/29/14 0402 03/29/14 0403 03/29/14 0446  BP: 122/77  122/77 134/84  Pulse: 84  81 90  Temp:    98.4 F (36.9 C)  TempSrc:    Oral  Resp:   17 16  Height:      Weight:      SpO2: 98% 100% 100% 97%
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
## 2014-04-04 ENCOUNTER — Other Ambulatory Visit: Payer: Self-pay | Admitting: Women's Health

## 2014-04-04 ENCOUNTER — Ambulatory Visit (HOSPITAL_COMMUNITY)
Admission: RE | Admit: 2014-04-04 | Discharge: 2014-04-04 | Disposition: A | Discharge status: Home / Self Care | Payer: 59 | Source: Ambulatory Visit | Attending: Women's Health | Admitting: Women's Health

## 2014-04-04 DIAGNOSIS — Z1231 Encounter for screening mammogram for malignant neoplasm of breast: Secondary | ICD-10-CM

## 2014-04-07 ENCOUNTER — Encounter (HOSPITAL_COMMUNITY): Payer: Self-pay | Admitting: Emergency Medicine

## 2014-04-11 ENCOUNTER — Other Ambulatory Visit: Payer: 59

## 2014-04-11 DIAGNOSIS — R7309 Other abnormal glucose: Secondary | ICD-10-CM

## 2014-04-12 LAB — HEMOGLOBIN A1C
Hgb A1c MFr Bld: 6.4 % — ABNORMAL HIGH (ref <5.7)
Mean Plasma Glucose: 137 mg/dL — ABNORMAL HIGH (ref <117)

## 2014-04-12 LAB — GLUCOSE, FASTING: GLUCOSE, FASTING: 106 mg/dL — AB (ref 70–99)

## 2015-01-13 ENCOUNTER — Encounter: Payer: Self-pay | Admitting: Women's Health

## 2015-01-13 ENCOUNTER — Other Ambulatory Visit (HOSPITAL_COMMUNITY)
Admission: RE | Admit: 2015-01-13 | Discharge: 2015-01-13 | Disposition: A | Discharge status: Home / Self Care | Payer: 59 | Source: Ambulatory Visit | Attending: Women's Health | Admitting: Women's Health

## 2015-01-13 ENCOUNTER — Ambulatory Visit (INDEPENDENT_AMBULATORY_CARE_PROVIDER_SITE_OTHER): Payer: 59 | Admitting: Women's Health

## 2015-01-13 VITALS — BP 118/78 | Ht 65.0 in | Wt 221.0 lb

## 2015-01-13 DIAGNOSIS — Z01419 Encounter for gynecological examination (general) (routine) without abnormal findings: Secondary | ICD-10-CM

## 2015-01-13 DIAGNOSIS — Z13220 Encounter for screening for lipoid disorders: Secondary | ICD-10-CM | POA: Diagnosis not present

## 2015-01-13 DIAGNOSIS — Z1151 Encounter for screening for human papillomavirus (HPV): Secondary | ICD-10-CM | POA: Insufficient documentation

## 2015-01-13 LAB — COMPREHENSIVE METABOLIC PANEL
ALK PHOS: 61 U/L (ref 33–115)
ALT: 40 U/L — ABNORMAL HIGH (ref 6–29)
AST: 26 U/L (ref 10–30)
Albumin: 4.2 g/dL (ref 3.6–5.1)
BUN: 12 mg/dL (ref 7–25)
CO2: 27 mmol/L (ref 20–31)
Calcium: 9.2 mg/dL (ref 8.6–10.2)
Chloride: 104 mmol/L (ref 98–110)
Creat: 0.82 mg/dL (ref 0.50–1.10)
GLUCOSE: 125 mg/dL — AB (ref 65–99)
Potassium: 4.5 mmol/L (ref 3.5–5.3)
SODIUM: 142 mmol/L (ref 135–146)
Total Bilirubin: 0.6 mg/dL (ref 0.2–1.2)
Total Protein: 7.1 g/dL (ref 6.1–8.1)

## 2015-01-13 LAB — CBC WITH DIFFERENTIAL/PLATELET
Basophils Absolute: 0 10*3/uL (ref 0.0–0.1)
Basophils Relative: 0 % (ref 0–1)
EOS ABS: 0.1 10*3/uL (ref 0.0–0.7)
EOS PCT: 1 % (ref 0–5)
HCT: 42.7 % (ref 36.0–46.0)
Hemoglobin: 14 g/dL (ref 12.0–15.0)
Lymphocytes Relative: 29 % (ref 12–46)
Lymphs Abs: 1.8 10*3/uL (ref 0.7–4.0)
MCH: 30 pg (ref 26.0–34.0)
MCHC: 32.8 g/dL (ref 30.0–36.0)
MCV: 91.6 fL (ref 78.0–100.0)
MPV: 11.4 fL (ref 8.6–12.4)
Monocytes Absolute: 0.3 10*3/uL (ref 0.1–1.0)
Monocytes Relative: 5 % (ref 3–12)
NEUTROS ABS: 4 10*3/uL (ref 1.7–7.7)
Neutrophils Relative %: 65 % (ref 43–77)
PLATELETS: 174 10*3/uL (ref 150–400)
RBC: 4.66 MIL/uL (ref 3.87–5.11)
RDW: 13.8 % (ref 11.5–15.5)
WBC: 6.2 10*3/uL (ref 4.0–10.5)

## 2015-01-13 LAB — LIPID PANEL
Cholesterol: 121 mg/dL — ABNORMAL LOW (ref 125–200)
HDL: 36 mg/dL — ABNORMAL LOW (ref >=46)
LDL Cholesterol: 73 mg/dL (ref <130)
TRIGLYCERIDES: 61 mg/dL (ref <150)
Total CHOL/HDL Ratio: 3.4 Ratio (ref <=5.0)
VLDL: 12 mg/dL (ref <30)

## 2015-01-13 LAB — HEMOGLOBIN A1C
HEMOGLOBIN A1C: 6.8 % — AB (ref <5.7)
MEAN PLASMA GLUCOSE: 148 mg/dL — AB (ref <117)

## 2015-01-13 NOTE — Progress Notes (Signed)
[name] [birth year] [id number]    History:    Presents for annual exam.  Monthly 4-5 day cycle 2 days heavy not sexually active. History of a 5 cm fundal fibroid asymptomatic. 93 benign breast biopsy/cyst. Elevated blood sugar last year saw Dr. Chalmers Cater who recommended exercise and diet reported same advice from our office but large charge not covered by insurance. Normal Pap history.   Past medical history, past surgical history, family history and social history were all reviewed and documented in the EPIC chart. Works at [employer]. Mother hypertension.  ROS:  A ROS was performed and pertinent positives and negatives are included.  Exam:  Filed Vitals:   01/13/15 0801  BP: 118/78    General appearance:  Normal Thyroid:  Symmetrical, normal in size, without palpable masses or nodularity. Respiratory  Auscultation:  Clear without wheezing or rhonchi Cardiovascular  Auscultation:  Regular rate, without rubs, murmurs or gallops  Edema/varicosities:  Not grossly evident Abdominal  Soft,nontender, without masses, guarding or rebound.  Liver/spleen:  No organomegaly noted  Hernia:  None appreciated  Skin  Inspection:  Grossly normal   Breasts: Examined lying and sitting.     Right: Without masses, retractions, discharge or axillary adenopathy.     Left: Without masses, retractions, discharge or axillary adenopathy. Gentitourinary   Inguinal/mons:  Normal without inguinal adenopathy  External genitalia:  Normal  BUS/Urethra/Skene's glands:  Normal  Vagina:  Normal  Cervix:  Normal  Uterus:  16 weeks size fibroid uterus.  Midline and mobile  Adnexa/parametria:     Rt: Without masses or tenderness.   Lt: Without masses or tenderness.  Anus and perineum: Normal  Digital rectal exam: Normal sphincter tone without palpated masses or tenderness  Assessment/Plan:  44 y.o. SBF G1 P0 for annual exam not desiring pregnancy at this time.  Asymptomatic Fibroid uterus Obesity  type 2  diabetes -diet  Plan: Reviewed importance of increasing exercise and decreasing calories, Weight Watchers encouraged, SBE's, annual screening mammogram 3-D tomography reviewed and encouraged history of dense breast. Contraception reviewed and declined will use condoms if sexually active. CBC, CMP, hemoglobin A1c, lipid panel, UA, Pap with HR HPV typing, new screening guidelines reviewed.   Huel Cote [facility], 8:33 AM 01/13/2015

## 2015-01-13 NOTE — Addendum Note (Signed)
Addended by: Burnett Kanaris on: 01/13/2015 08:42 AM   Modules accepted: Orders

## 2015-01-13 NOTE — Patient Instructions (Signed)
Diabetes Mellitus and Food It is important for you to manage your blood sugar (glucose) level. Your blood glucose level can be greatly affected by what you eat. Eating healthier foods in the appropriate amounts throughout the day at about the same time each day will help you control your blood glucose level. It can also help slow or prevent worsening of your diabetes mellitus. Healthy eating may even help you improve the level of your blood pressure and reach or maintain a healthy weight.  HOW CAN FOOD AFFECT ME? Carbohydrates Carbohydrates affect your blood glucose level more than any other type of food. Your dietitian will help you determine how many carbohydrates to eat at each meal and teach you how to count carbohydrates. Counting carbohydrates is important to keep your blood glucose at a healthy level, especially if you are using insulin or taking certain medicines for diabetes mellitus. Alcohol Alcohol can cause sudden decreases in blood glucose (hypoglycemia), especially if you use insulin or take certain medicines for diabetes mellitus. Hypoglycemia can be a life-threatening condition. Symptoms of hypoglycemia (sleepiness, dizziness, and disorientation) are similar to symptoms of having too much alcohol.  If your health care provider has given you approval to drink alcohol, do so in moderation and use the following guidelines:  Women should not have more than one drink per day, and men should not have more than two drinks per day. One drink is equal to:  12 oz of beer.  5 oz of wine.  1 oz of hard liquor.  Do not drink on an empty stomach.  Keep yourself hydrated. Have water, diet soda, or unsweetened iced tea.  Regular soda, juice, and other mixers might contain a lot of carbohydrates and should be counted. WHAT FOODS ARE NOT RECOMMENDED? As you make food choices, it is important to remember that all foods are not the same. Some foods have fewer nutrients per serving than other  foods, even though they might have the same number of calories or carbohydrates. It is difficult to get your body what it needs when you eat foods with fewer nutrients. Examples of foods that you should avoid that are high in calories and carbohydrates but low in nutrients include:  Trans fats (most processed foods list trans fats on the Nutrition Facts label).  Regular soda.  Juice.  Candy.  Sweets, such as cake, pie, doughnuts, and cookies.  Fried foods. WHAT FOODS CAN I EAT? Have nutrient-rich foods, which will nourish your body and keep you healthy. The food you should eat also will depend on several factors, including:  The calories you need.  The medicines you take.  Your weight.  Your blood glucose level.  Your blood pressure level.  Your cholesterol level. You also should eat a variety of foods, including:  Protein, such as meat, poultry, fish, tofu, nuts, and seeds (lean animal proteins are best).  Fruits.  Vegetables.  Dairy products, such as milk, cheese, and yogurt (low fat is best).  Breads, grains, pasta, cereal, rice, and beans.  Fats such as olive oil, trans fat-free margarine, canola oil, avocado, and olives. DOES EVERYONE WITH DIABETES MELLITUS HAVE THE SAME MEAL PLAN? Because every person with diabetes mellitus is different, there is not one meal plan that works for everyone. It is very important that you meet with a dietitian who will help you create a meal plan that is just right for you. Document Released: 02/17/2005 Document Revised: 05/28/2013 Document Reviewed: 04/19/2013 [employer] Patient Information 2015 [employer]. This   information is not intended to replace advice given to you by your health care provider. Make sure you discuss any questions you have with your health care provider. Health Maintenance Adopting a healthy lifestyle and getting preventive care can go a long way to promote health and wellness. Talk with your health care  provider about what schedule of regular examinations is right for you. This is a good chance for you to check in with your provider about disease prevention and staying healthy. In between checkups, there are plenty of things you can do on your own. Experts have done a lot of research about which lifestyle changes and preventive measures are most likely to keep you healthy. Ask your health care provider for more information. WEIGHT AND DIET  Eat a healthy diet  Be sure to include plenty of vegetables, fruits, low-fat dairy products, and lean protein.  Do not eat a lot of foods high in solid fats, added sugars, or salt.  Get regular exercise. This is one of the most important things you can do for your health.  Most adults should exercise for at least 150 minutes each week. The exercise should increase your heart rate and make you sweat (moderate-intensity exercise).  Most adults should also do strengthening exercises at least twice a week. This is in addition to the moderate-intensity exercise.  Maintain a healthy weight  Body mass index (BMI) is a measurement that can be used to identify possible weight problems. It estimates body fat based on height and weight. Your health care provider can help determine your BMI and help you achieve or maintain a healthy weight.  For females 70 years of age and older:   A BMI below 18.5 is considered underweight.  A BMI of 18.5 to 24.9 is normal.  A BMI of 25 to 29.9 is considered overweight.  A BMI of 30 and above is considered obese.  Watch levels of cholesterol and blood lipids  You should start having your blood tested for lipids and cholesterol at 43 years of age, then have this test every 5 years.  You may need to have your cholesterol levels checked more often if:  Your lipid or cholesterol levels are high.  You are older than 43 years of age.  You are at high risk for heart disease.  CANCER SCREENING   Lung Cancer  Lung  cancer screening is recommended for adults 46-45 years old who are at high risk for lung cancer because of a history of smoking.  A yearly low-dose CT scan of the lungs is recommended for people who:  Currently smoke.  Have quit within the past 15 years.  Have at least a 30-pack-year history of smoking. A pack year is smoking an average of one pack of cigarettes a day for 1 year.  Yearly screening should continue until it has been 15 years since you quit.  Yearly screening should stop if you develop a health problem that would prevent you from having lung cancer treatment.  Breast Cancer  Practice breast self-awareness. This means understanding how your breasts normally appear and feel.  It also means doing regular breast self-exams. Let your health care provider know about any changes, no matter how small.  If you are in your 20s or 30s, you should have a clinical breast exam (CBE) by a health care provider every 1-3 years as part of a regular health exam.  If you are 77 or older, have a CBE every year. Also consider having  a breast X-ray (mammogram) every year.  If you have a family history of breast cancer, talk to your health care provider about genetic screening.  If you are at high risk for breast cancer, talk to your health care provider about having an MRI and a mammogram every year.  Breast cancer gene (BRCA) assessment is recommended for women who have family members with BRCA-related cancers. BRCA-related cancers include:  Breast.  Ovarian.  Tubal.  Peritoneal cancers.  Results of the assessment will determine the need for genetic counseling and BRCA1 and BRCA2 testing. Cervical Cancer Routine pelvic examinations to screen for cervical cancer are no longer recommended for nonpregnant women who are considered low risk for cancer of the pelvic organs (ovaries, uterus, and vagina) and who do not have symptoms. A pelvic examination may be necessary if you have symptoms  including those associated with pelvic infections. Ask your health care provider if a screening pelvic exam is right for you.   The Pap test is the screening test for cervical cancer for women who are considered at risk.  If you had a hysterectomy for a problem that was not cancer or a condition that could lead to cancer, then you no longer need Pap tests.  If you are older than 65 years, and you have had normal Pap tests for the past 10 years, you no longer need to have Pap tests.  If you have had past treatment for cervical cancer or a condition that could lead to cancer, you need Pap tests and screening for cancer for at least 20 years after your treatment.  If you no longer get a Pap test, assess your risk factors if they change (such as having a new sexual partner). This can affect whether you should start being screened again.  Some women have medical problems that increase their chance of getting cervical cancer. If this is the case for you, your health care provider may recommend more frequent screening and Pap tests.  The human papillomavirus (HPV) test is another test that may be used for cervical cancer screening. The HPV test looks for the virus that can cause cell changes in the cervix. The cells collected during the Pap test can be tested for HPV.  The HPV test can be used to screen women 59 years of age and older. Getting tested for HPV can extend the interval between normal Pap tests from three to five years.  An HPV test also should be used to screen women of any age who have unclear Pap test results.  After 43 years of age, women should have HPV testing as often as Pap tests.  Colorectal Cancer  This type of cancer can be detected and often prevented.  Routine colorectal cancer screening usually begins at 43 years of age and continues through 43 years of age.  Your health care provider may recommend screening at an earlier age if you have risk factors for colon  cancer.  Your health care provider may also recommend using home test kits to check for hidden blood in the stool.  A small camera at the end of a tube can be used to examine your colon directly (sigmoidoscopy or colonoscopy). This is done to check for the earliest forms of colorectal cancer.  Routine screening usually begins at age 57.  Direct examination of the colon should be repeated every 5-10 years through 43 years of age. However, you may need to be screened more often if early forms of precancerous polyps  or small growths are found. Skin Cancer  Check your skin from head to toe regularly.  Tell your health care provider about any new moles or changes in moles, especially if there is a change in a mole's shape or color.  Also tell your health care provider if you have a mole that is larger than the size of a pencil eraser.  Always use sunscreen. Apply sunscreen liberally and repeatedly throughout the day.  Protect yourself by wearing long sleeves, pants, a wide-brimmed hat, and sunglasses whenever you are outside. HEART DISEASE, DIABETES, AND HIGH BLOOD PRESSURE   Have your blood pressure checked at least every 1-2 years. High blood pressure causes heart disease and increases the risk of stroke.  If you are between 71 years and 66 years old, ask your health care provider if you should take aspirin to prevent strokes.  Have regular diabetes screenings. This involves taking a blood sample to check your fasting blood sugar level.  If you are at a normal weight and have a low risk for diabetes, have this test once every three years after 43 years of age.  If you are overweight and have a high risk for diabetes, consider being tested at a younger age or more often. PREVENTING INFECTION  Hepatitis B  If you have a higher risk for hepatitis B, you should be screened for this virus. You are considered at high risk for hepatitis B if:  You were born in a country where hepatitis B is  common. Ask your health care provider which countries are considered high risk.  Your parents were born in a high-risk country, and you have not been immunized against hepatitis B (hepatitis B vaccine).  You have HIV or AIDS.  You use needles to inject street drugs.  You live with someone who has hepatitis B.  You have had sex with someone who has hepatitis B.  You get hemodialysis treatment.  You take certain medicines for conditions, including cancer, organ transplantation, and autoimmune conditions. Hepatitis C  Blood testing is recommended for:  Everyone born from [birth year] through 1964.  Anyone with known risk factors for hepatitis C. Sexually transmitted infections (STIs)  You should be screened for sexually transmitted infections (STIs) including gonorrhea and chlamydia if:  You are sexually active and are younger than 43 years of age.  You are older than 43 years of age and your health care provider tells you that you are at risk for this type of infection.  Your sexual activity has changed since you were last screened and you are at an increased risk for chlamydia or gonorrhea. Ask your health care provider if you are at risk.  If you do not have HIV, but are at risk, it may be recommended that you take a prescription medicine daily to prevent HIV infection. This is called pre-exposure prophylaxis (PrEP). You are considered at risk if:  You are sexually active and do not regularly use condoms or know the HIV status of your partner(s).  You take drugs by injection.  You are sexually active with a partner who has HIV. Talk with your health care provider about whether you are at high risk of being infected with HIV. If you choose to begin PrEP, you should first be tested for HIV. You should then be tested every 3 months for as long as you are taking PrEP.  PREGNANCY   If you are premenopausal and you may become pregnant, ask your health care provider about preconception  counseling.  If you may become pregnant, take 400 to 800 micrograms (mcg) of folic acid every day.  If you want to prevent pregnancy, talk to your health care provider about birth control (contraception). OSTEOPOROSIS AND MENOPAUSE   Osteoporosis is a disease in which the bones lose minerals and strength with aging. This can result in serious bone fractures. Your risk for osteoporosis can be identified using a bone density scan.  If you are 76 years of age or older, or if you are at risk for osteoporosis and fractures, ask your health care provider if you should be screened.  Ask your health care provider whether you should take a calcium or vitamin D supplement to lower your risk for osteoporosis.  Menopause may have certain physical symptoms and risks.  Hormone replacement therapy may reduce some of these symptoms and risks. Talk to your health care provider about whether hormone replacement therapy is right for you.  HOME CARE INSTRUCTIONS   Schedule regular health, dental, and eye exams.  Stay current with your immunizations.   Do not use any tobacco products including cigarettes, chewing tobacco, or electronic cigarettes.  If you are pregnant, do not drink alcohol.  If you are breastfeeding, limit how much and how often you drink alcohol.  Limit alcohol intake to no more than 1 drink per day for nonpregnant women. One drink equals 12 ounces of beer, 5 ounces of wine, or 1 ounces of hard liquor.  Do not use street drugs.  Do not share needles.  Ask your health care provider for help if you need support or information about quitting drugs.  Tell your health care provider if you often feel depressed.  Tell your health care provider if you have ever been abused or do not feel safe at home. Document Released: 12/06/2010 Document Revised: 10/07/2013 Document Reviewed: 04/24/2013 [facility] Patient Information 2015 [place], [place]. This information is not intended to replace  advice given to you by your health care provider. Make sure you discuss any questions you have with your health care provider.

## 2015-01-14 LAB — URINALYSIS W MICROSCOPIC + REFLEX CULTURE
Bacteria, UA: NONE SEEN [HPF]
Bilirubin Urine: NEGATIVE
Casts: NONE SEEN [LPF]
Crystals: NONE SEEN [HPF]
GLUCOSE, UA: NEGATIVE
HGB URINE DIPSTICK: NEGATIVE
KETONES UR: NEGATIVE
LEUKOCYTES UA: NEGATIVE
NITRITE: NEGATIVE
Protein, ur: NEGATIVE
RBC / HPF: NONE SEEN RBC/HPF (ref <=2)
Specific Gravity, Urine: 1.024 (ref 1.001–1.035)
WBC UA: NONE SEEN WBC/HPF (ref <=5)
Yeast: NONE SEEN [HPF]
pH: 5 (ref 5.0–8.0)

## 2015-01-14 LAB — CYTOLOGY - PAP

## 2015-03-10 ENCOUNTER — Other Ambulatory Visit: Payer: Self-pay

## 2015-03-10 DIAGNOSIS — Z1231 Encounter for screening mammogram for malignant neoplasm of breast: Secondary | ICD-10-CM

## 2015-04-07 ENCOUNTER — Ambulatory Visit
Admission: RE | Admit: 2015-04-07 | Discharge: 2015-04-07 | Disposition: A | Discharge status: Home / Self Care | Payer: 59 | Source: Ambulatory Visit

## 2015-04-07 DIAGNOSIS — Z1231 Encounter for screening mammogram for malignant neoplasm of breast: Secondary | ICD-10-CM

## 2015-04-09 ENCOUNTER — Other Ambulatory Visit: Payer: Self-pay | Admitting: Women's Health

## 2015-04-09 DIAGNOSIS — R928 Other abnormal and inconclusive findings on diagnostic imaging of breast: Secondary | ICD-10-CM

## 2015-04-15 ENCOUNTER — Ambulatory Visit
Admission: RE | Admit: 2015-04-15 | Discharge: 2015-04-15 | Disposition: A | Discharge status: Home / Self Care | Payer: 59 | Source: Ambulatory Visit | Attending: Women's Health | Admitting: Women's Health

## 2015-04-15 ENCOUNTER — Encounter: Payer: Self-pay | Admitting: Women's Health

## 2015-04-15 DIAGNOSIS — R928 Other abnormal and inconclusive findings on diagnostic imaging of breast: Secondary | ICD-10-CM

## 2016-01-14 ENCOUNTER — Encounter: Payer: 59 | Admitting: Women's Health

## 2016-01-20 ENCOUNTER — Ambulatory Visit (INDEPENDENT_AMBULATORY_CARE_PROVIDER_SITE_OTHER): Payer: 59 | Admitting: Women's Health

## 2016-01-20 ENCOUNTER — Encounter: Payer: Self-pay | Admitting: Women's Health

## 2016-01-20 VITALS — BP 120/78 | Ht 65.0 in | Wt 211.0 lb

## 2016-01-20 DIAGNOSIS — Z13220 Encounter for screening for lipoid disorders: Secondary | ICD-10-CM | POA: Diagnosis not present

## 2016-01-20 DIAGNOSIS — Z01419 Encounter for gynecological examination (general) (routine) without abnormal findings: Secondary | ICD-10-CM

## 2016-01-20 DIAGNOSIS — Z113 Encounter for screening for infections with a predominantly sexual mode of transmission: Secondary | ICD-10-CM

## 2016-01-20 DIAGNOSIS — N912 Amenorrhea, unspecified: Secondary | ICD-10-CM

## 2016-01-20 DIAGNOSIS — Z833 Family history of diabetes mellitus: Secondary | ICD-10-CM | POA: Diagnosis not present

## 2016-01-20 LAB — COMPREHENSIVE METABOLIC PANEL
ALT: 26 U/L (ref 6–29)
AST: 22 U/L (ref 10–30)
Albumin: 4.1 g/dL (ref 3.6–5.1)
Alkaline Phosphatase: 58 U/L (ref 33–115)
BUN: 8 mg/dL (ref 7–25)
CHLORIDE: 105 mmol/L (ref 98–110)
CO2: 27 mmol/L (ref 20–31)
CREATININE: 0.8 mg/dL (ref 0.50–1.10)
Calcium: 8.8 mg/dL (ref 8.6–10.2)
GLUCOSE: 103 mg/dL — AB (ref 65–99)
POTASSIUM: 3.8 mmol/L (ref 3.5–5.3)
SODIUM: 139 mmol/L (ref 135–146)
Total Bilirubin: 0.8 mg/dL (ref 0.2–1.2)
Total Protein: 6.9 g/dL (ref 6.1–8.1)

## 2016-01-20 LAB — LIPID PANEL
Cholesterol: 146 mg/dL (ref 125–200)
HDL: 53 mg/dL (ref >=46)
LDL CALC: 81 mg/dL (ref <130)
Total CHOL/HDL Ratio: 2.8 Ratio (ref <=5.0)
Triglycerides: 61 mg/dL (ref <150)
VLDL: 12 mg/dL (ref <30)

## 2016-01-20 LAB — CBC WITH DIFFERENTIAL/PLATELET
Basophils Absolute: 0 cells/uL (ref 0–200)
Basophils Relative: 0 %
EOS ABS: 47 {cells}/uL (ref 15–500)
Eosinophils Relative: 1 %
HEMATOCRIT: 40.8 % (ref 35.0–45.0)
Hemoglobin: 13.3 g/dL (ref 11.7–15.5)
Lymphocytes Relative: 31 %
Lymphs Abs: 1457 cells/uL (ref 850–3900)
MCH: 29.9 pg (ref 27.0–33.0)
MCHC: 32.6 g/dL (ref 32.0–36.0)
MCV: 91.7 fL (ref 80.0–100.0)
MPV: 11.1 fL (ref 7.5–12.5)
Monocytes Absolute: 282 cells/uL (ref 200–950)
Monocytes Relative: 6 %
NEUTROS ABS: 2914 {cells}/uL (ref 1500–7800)
Neutrophils Relative %: 62 %
Platelets: 191 10*3/uL (ref 140–400)
RBC: 4.45 MIL/uL (ref 3.80–5.10)
RDW: 13.7 % (ref 11.0–15.0)
WBC: 4.7 10*3/uL (ref 3.8–10.8)

## 2016-01-20 LAB — HCG, SERUM, QUALITATIVE: PREG SERUM: NEGATIVE

## 2016-01-20 LAB — PREGNANCY, URINE: Preg Test, Ur: NEGATIVE

## 2016-01-20 MED ORDER — MEDROXYPROGESTERONE ACETATE 10 MG PO TABS: 10.0000 mg | ORAL_TABLET | Freq: Every day | ORAL | 0 refills | Status: DC

## 2016-01-20 NOTE — Patient Instructions (Signed)
Basic Carbohydrate Counting for Diabetes Mellitus Carbohydrate counting is a method for keeping track of the amount of carbohydrates you eat. Eating carbohydrates naturally increases the level of sugar (glucose) in your blood, so it is important for you to know the amount that is okay for you to have in every meal. Carbohydrate counting helps keep the level of glucose in your blood within normal limits. The amount of carbohydrates allowed is different for every person. A dietitian can help you calculate the amount that is right for you. Once you know the amount of carbohydrates you can have, you can count the carbohydrates in the foods you want to eat. Carbohydrates are found in the following foods:  Grains, such as breads and cereals.  Dried beans and soy products.  Starchy vegetables, such as potatoes, peas, and corn.  Fruit and fruit juices.  Milk and yogurt.  Sweets and snack foods, such as cake, cookies, candy, chips, soft drinks, and fruit drinks. CARBOHYDRATE COUNTING There are two ways to count the carbohydrates in your food. You can use either of the methods or a combination of both. Reading the "Nutrition Facts" on [place] The "Nutrition Facts" is an area that is included on the labels of almost all packaged food and beverages in the [place]. It includes the serving size of that food or beverage and information about the nutrients in each serving of the food, including the grams (g) of carbohydrate per serving.  Decide the number of servings of this food or beverage that you will be able to eat or drink. Multiply that number of servings by the number of grams of carbohydrate that is listed on the label for that serving. The total will be the amount of carbohydrates you will be having when you eat or drink this food or beverage. Learning Standard Serving Sizes of Food When you eat food that is not packaged or does not include "Nutrition Facts" on the label, you need to  measure the servings in order to count the amount of carbohydrates.A serving of most carbohydrate-rich foods contains about 15 g of carbohydrates. The following list includes serving sizes of carbohydrate-rich foods that provide 15 g ofcarbohydrate per serving:   1 slice of bread (1 oz) or 1 six-inch tortilla.    of a hamburger bun or English muffin.  4-6 crackers.   cup unsweetened dry cereal.    cup hot cereal.   cup rice or pasta.    cup mashed potatoes or  of a large baked potato.  1 cup fresh fruit or one small piece of fruit.    cup canned or frozen fruit or fruit juice.  1 cup milk.   cup plain fat-free yogurt or yogurt sweetened with artificial sweeteners.   cup cooked dried beans or starchy vegetable, such as peas, corn, or potatoes.  Decide the number of standard-size servings that you will eat. Multiply that number of servings by 15 (the grams of carbohydrates in that serving). For example, if you eat 2 cups of strawberries, you will have eaten 2 servings and 30 g of carbohydrates (2 servings x 15 g = 30 g). For foods such as soups and casseroles, in which more than one food is mixed in, you will need to count the carbohydrates in each food that is included. EXAMPLE OF CARBOHYDRATE COUNTING Sample Dinner  3 oz chicken breast.   cup of brown rice.   cup of corn.  1 cup milk.   1 cup strawberries with  sugar-free whipped topping.  Carbohydrate Calculation Step 1: Identify the foods that contain carbohydrates:   Rice.   Corn.   Milk.   Strawberries. Step 2:Calculate the number of servings eaten of each:   2 servings of rice.   1 serving of corn.   1 serving of milk.   1 serving of strawberries. Step 3: Multiply each of those number of servings by 15 g:   2 servings of rice x 15 g = 30 g.   1 serving of corn x 15 g = 15 g.   1 serving of milk x 15 g = 15 g.   1 serving of strawberries x 15 g = 15 g. Step 4: Add  together all of the amounts to find the total grams of carbohydrates eaten: 30 g + 15 g + 15 g + 15 g = 75 g.   This information is not intended to replace advice given to you by your health care provider. Make sure you discuss any questions you have with your health care provider.   Document Released: 05/23/2005 Document Revised: 06/13/2014 Document Reviewed: 04/19/2013 Elsevier Interactive Patient Education 2016 Heath Maintenance, Female Adopting a healthy lifestyle and getting preventive care can go a long way to promote health and wellness. Talk with your health care provider about what schedule of regular examinations is right for you. This is a good chance for you to check in with your provider about disease prevention and staying healthy. In between checkups, there are plenty of things you can do on your own. Experts have done a lot of research about which lifestyle changes and preventive measures are most likely to keep you healthy. Ask your health care provider for more information. WEIGHT AND DIET  Eat a healthy diet  Be sure to include plenty of vegetables, fruits, low-fat dairy products, and lean protein.  Do not eat a lot of foods high in solid fats, added sugars, or salt.  Get regular exercise. This is one of the most important things you can do for your health.  Most adults should exercise for at least 150 minutes each week. The exercise should increase your heart rate and make you sweat (moderate-intensity exercise).  Most adults should also do strengthening exercises at least twice a week. This is in addition to the moderate-intensity exercise.  Maintain a healthy weight  Body mass index (BMI) is a measurement that can be used to identify possible weight problems. It estimates body fat based on height and weight. Your health care provider can help determine your BMI and help you achieve or maintain a healthy weight.  For females 87 years of age and older:    A BMI below 18.5 is considered underweight.  A BMI of 18.5 to 24.9 is normal.  A BMI of 25 to 29.9 is considered overweight.  A BMI of 30 and above is considered obese.  Watch levels of cholesterol and blood lipids  You should start having your blood tested for lipids and cholesterol at 44 years of age, then have this test every 5 years.  You may need to have your cholesterol levels checked more often if:  Your lipid or cholesterol levels are high.  You are older than 44 years of age.  You are at high risk for heart disease.  CANCER SCREENING   Lung Cancer  Lung cancer screening is recommended for adults 12-76 years old who are at high risk for lung cancer because of a history of  smoking.  A yearly low-dose CT scan of the lungs is recommended for people who:  Currently smoke.  Have quit within the past 15 years.  Have at least a 30-pack-year history of smoking. A pack year is smoking an average of one pack of cigarettes a day for 1 year.  Yearly screening should continue until it has been 15 years since you quit.  Yearly screening should stop if you develop a health problem that would prevent you from having lung cancer treatment.  Breast Cancer  Practice breast self-awareness. This means understanding how your breasts normally appear and feel.  It also means doing regular breast self-exams. Let your health care provider know about any changes, no matter how small.  If you are in your 20s or 30s, you should have a clinical breast exam (CBE) by a health care provider every 1-3 years as part of a regular health exam.  If you are 66 or older, have a CBE every year. Also consider having a breast X-ray (mammogram) every year.  If you have a family history of breast cancer, talk to your health care provider about genetic screening.  If you are at high risk for breast cancer, talk to your health care provider about having an MRI and a mammogram every year.  Breast  cancer gene (BRCA) assessment is recommended for women who have family members with BRCA-related cancers. BRCA-related cancers include:  Breast.  Ovarian.  Tubal.  Peritoneal cancers.  Results of the assessment will determine the need for genetic counseling and BRCA1 and BRCA2 testing. Cervical Cancer Your health care provider may recommend that you be screened regularly for cancer of the pelvic organs (ovaries, uterus, and vagina). This screening involves a pelvic examination, including checking for microscopic changes to the surface of your cervix (Pap test). You may be encouraged to have this screening done every 3 years, beginning at age 24.  For women ages 17-65, health care providers may recommend pelvic exams and Pap testing every 3 years, or they may recommend the Pap and pelvic exam, combined with testing for human papilloma virus (HPV), every 5 years. Some types of HPV increase your risk of cervical cancer. Testing for HPV may also be done on women of any age with unclear Pap test results.  Other health care providers may not recommend any screening for nonpregnant women who are considered low risk for pelvic cancer and who do not have symptoms. Ask your health care provider if a screening pelvic exam is right for you.  If you have had past treatment for cervical cancer or a condition that could lead to cancer, you need Pap tests and screening for cancer for at least 20 years after your treatment. If Pap tests have been discontinued, your risk factors (such as having a new sexual partner) need to be reassessed to determine if screening should resume. Some women have medical problems that increase the chance of getting cervical cancer. In these cases, your health care provider may recommend more frequent screening and Pap tests. Colorectal Cancer  This type of cancer can be detected and often prevented.  Routine colorectal cancer screening usually begins at 44 years of age and  continues through 44 years of age.  Your health care provider may recommend screening at an earlier age if you have risk factors for colon cancer.  Your health care provider may also recommend using home test kits to check for hidden blood in the stool.  A small camera at the end  of a tube can be used to examine your colon directly (sigmoidoscopy or colonoscopy). This is done to check for the earliest forms of colorectal cancer.  Routine screening usually begins at age 50.  Direct examination of the colon should be repeated every 5-10 years through 44 years of age. However, you may need to be screened more often if early forms of precancerous polyps or small growths are found. Skin Cancer  Check your skin from head to toe regularly.  Tell your health care provider about any new moles or changes in moles, especially if there is a change in a mole's shape or color.  Also tell your health care provider if you have a mole that is larger than the size of a pencil eraser.  Always use sunscreen. Apply sunscreen liberally and repeatedly throughout the day.  Protect yourself by wearing long sleeves, pants, a wide-brimmed hat, and sunglasses whenever you are outside. HEART DISEASE, DIABETES, AND HIGH BLOOD PRESSURE   High blood pressure causes heart disease and increases the risk of stroke. High blood pressure is more likely to develop in:  People who have blood pressure in the high end of the normal range (130-139/85-89 mm Hg).  People who are overweight or obese.  People who are African American.  If you are 18-39 years of age, have your blood pressure checked every 3-5 years. If you are 40 years of age or older, have your blood pressure checked every year. You should have your blood pressure measured twice--once when you are at a hospital or clinic, and once when you are not at a hospital or clinic. Record the average of the two measurements. To check your blood pressure when you are not at  a hospital or clinic, you can use:  An automated blood pressure machine at a pharmacy.  A home blood pressure monitor.  If you are between 55 years and 79 years old, ask your health care provider if you should take aspirin to prevent strokes.  Have regular diabetes screenings. This involves taking a blood sample to check your fasting blood sugar level.  If you are at a normal weight and have a low risk for diabetes, have this test once every three years after 45 years of age.  If you are overweight and have a high risk for diabetes, consider being tested at a younger age or more often. PREVENTING INFECTION  Hepatitis B  If you have a higher risk for hepatitis B, you should be screened for this virus. You are considered at high risk for hepatitis B if:  You were born in a country where hepatitis B is common. Ask your health care provider which countries are considered high risk.  Your parents were born in a high-risk country, and you have not been immunized against hepatitis B (hepatitis B vaccine).  You have HIV or AIDS.  You use needles to inject street drugs.  You live with someone who has hepatitis B.  You have had sex with someone who has hepatitis B.  You get hemodialysis treatment.  You take certain medicines for conditions, including cancer, organ transplantation, and autoimmune conditions. Hepatitis C  Blood testing is recommended for:  Everyone born from 1945 through 1965.  Anyone with known risk factors for hepatitis C. Sexually transmitted infections (STIs)  You should be screened for sexually transmitted infections (STIs) including gonorrhea and chlamydia if:  You are sexually active and are younger than 44 years of age.  You are older than 44   years of age and your health care provider tells you that you are at risk for this type of infection.  Your sexual activity has changed since you were last screened and you are at an increased risk for chlamydia or  gonorrhea. Ask your health care provider if you are at risk.  If you do not have HIV, but are at risk, it may be recommended that you take a prescription medicine daily to prevent HIV infection. This is called pre-exposure prophylaxis (PrEP). You are considered at risk if:  You are sexually active and do not regularly use condoms or know the HIV status of your partner(s).  You take drugs by injection.  You are sexually active with a partner who has HIV. Talk with your health care provider about whether you are at high risk of being infected with HIV. If you choose to begin PrEP, you should first be tested for HIV. You should then be tested every 3 months for as long as you are taking PrEP.  PREGNANCY   If you are premenopausal and you may become pregnant, ask your health care provider about preconception counseling.  If you may become pregnant, take 400 to 800 micrograms (mcg) of folic acid every day.  If you want to prevent pregnancy, talk to your health care provider about birth control (contraception). OSTEOPOROSIS AND MENOPAUSE   Osteoporosis is a disease in which the bones lose minerals and strength with aging. This can result in serious bone fractures. Your risk for osteoporosis can be identified using a bone density scan.  If you are 65 years of age or older, or if you are at risk for osteoporosis and fractures, ask your health care provider if you should be screened.  Ask your health care provider whether you should take a calcium or vitamin D supplement to lower your risk for osteoporosis.  Menopause may have certain physical symptoms and risks.  Hormone replacement therapy may reduce some of these symptoms and risks. Talk to your health care provider about whether hormone replacement therapy is right for you.  HOME CARE INSTRUCTIONS   Schedule regular health, dental, and eye exams.  Stay current with your immunizations.   Do not use any tobacco products including  cigarettes, chewing tobacco, or electronic cigarettes.  If you are pregnant, do not drink alcohol.  If you are breastfeeding, limit how much and how often you drink alcohol.  Limit alcohol intake to no more than 1 drink per day for nonpregnant women. One drink equals 12 ounces of beer, 5 ounces of wine, or 1 ounces of hard liquor.  Do not use street drugs.  Do not share needles.  Ask your health care provider for help if you need support or information about quitting drugs.  Tell your health care provider if you often feel depressed.  Tell your health care provider if you have ever been abused or do not feel safe at home.   This information is not intended to replace advice given to you by your health care provider. Make sure you discuss any questions you have with your health care provider.   Document Released: 12/06/2010 Document Revised: 06/13/2014 Document Reviewed: 04/24/2013 Elsevier Interactive Patient Education 2016 Elsevier Inc.  

## 2016-01-20 NOTE — Progress Notes (Signed)
[name] 07-30-1970 [id number]    History:    Presents for annual exam.  Monthly cycle until June last cycle May, several negative home UPT's. New partner in May. Normal Pap history. History of a 5 cm fundal fibroid. 1993 benign breast biopsy. History of an elevated blood sugar was seen at Dr. Chalmers Cater was told to decrease carbs, loose weight, had a very large bill and does not want to return.  Past medical history, past surgical history, family history and social history were all reviewed and documented in the EPIC chart. Works at [employer]. Mother hypertension.  ROS:  A ROS was performed and pertinent positives and negatives are included.  Exam:  Vitals:   01/20/16 0804  BP: 120/78  Weight: 211 lb (95.7 kg)  Height: 5\' 5"  (1.651 m)   Body mass index is 35.11 kg/m.   General appearance:  Normal Thyroid:  Symmetrical, normal in size, without palpable masses or nodularity. Respiratory  Auscultation:  Clear without wheezing or rhonchi Cardiovascular  Auscultation:  Regular rate, without rubs, murmurs or gallops  Edema/varicosities:  Not grossly evident Abdominal  Soft,nontender, without masses, guarding or rebound.  Liver/spleen:  No organomegaly noted  Hernia:  None appreciated  Skin  Inspection:  Grossly normal   Breasts: Examined lying and sitting.     Right: Without masses, retractions, discharge or axillary adenopathy.     Left: Without masses, retractions, discharge or axillary adenopathy. Gentitourinary   Inguinal/mons:  Normal without inguinal adenopathy  External genitalia:  Normal  BUS/Urethra/Skene's glands:  Normal  Vagina:  Normal  Cervix:  Normal  Uterus: Enlarged, 16 weeks fibroids.  Midline and mobile  Adnexa/parametria:     Rt: Without masses or tenderness.   Lt: Without masses or tenderness.  Anus and perineum: Normal  Digital rectal exam: Normal sphincter tone without palpated masses or tenderness  Assessment/Plan:  45 y.o.  SBF G1 P0  for annual exam.     Amenorrheic with no menopausal symptoms 1993 benign breast biopsy History of fibroid uterus 5 cm fundal Obesity Type 2 diabetes diet STD screen  Plan: UPT negative. Qualitative hCG if negative Provera 5 mg by mouth daily for 5 days. Contraception options reviewed and declined will continue condoms. SBE's, continue annual 3-D screening mammogram. Congratulated on 10 pound weight loss continue healthy diet, decrease calories, increase exercise. CBC, lipid panel, hemoglobin A1c, vitamin D, CMP, UA, GC/Chlamydia, HIV, hep B, C, RPR. Pap normal with negative HR HPV 2016, new screening guidelines reviewed.    Huel Cote [facility], 8:28 AM 01/20/2016

## 2016-01-21 ENCOUNTER — Other Ambulatory Visit: Payer: Self-pay | Admitting: Women's Health

## 2016-01-21 DIAGNOSIS — E559 Vitamin D deficiency, unspecified: Secondary | ICD-10-CM

## 2016-01-21 LAB — HEMOGLOBIN A1C
Hgb A1c MFr Bld: 5.8 % — ABNORMAL HIGH (ref <5.7)
Mean Plasma Glucose: 120 mg/dL

## 2016-01-21 LAB — URINALYSIS W MICROSCOPIC + REFLEX CULTURE
Bacteria, UA: NONE SEEN [HPF]
Bilirubin Urine: NEGATIVE
CRYSTALS: NONE SEEN [HPF]
Casts: NONE SEEN [LPF]
Glucose, UA: NEGATIVE
Hgb urine dipstick: NEGATIVE
KETONES UR: NEGATIVE
Nitrite: NEGATIVE
Protein, ur: NEGATIVE
RBC / HPF: NONE SEEN RBC/HPF (ref <=2)
SPECIFIC GRAVITY, URINE: 1.021 (ref 1.001–1.035)
Squamous Epithelial / LPF: NONE SEEN [HPF] (ref <=5)
Yeast: NONE SEEN [HPF]
pH: 5.5 (ref 5.0–8.0)

## 2016-01-21 LAB — GC/CHLAMYDIA PROBE AMP
CT Probe RNA: NOT DETECTED
GC Probe RNA: NOT DETECTED

## 2016-01-21 LAB — HIV ANTIBODY (ROUTINE TESTING W REFLEX): HIV: NONREACTIVE

## 2016-01-21 LAB — HEPATITIS C ANTIBODY: HCV AB: NEGATIVE

## 2016-01-21 LAB — VITAMIN D 25 HYDROXY (VIT D DEFICIENCY, FRACTURES): Vit D, 25-Hydroxy: 14 ng/mL — ABNORMAL LOW (ref 30–100)

## 2016-01-21 LAB — RPR

## 2016-01-21 LAB — HEPATITIS B SURFACE ANTIGEN: Hepatitis B Surface Ag: NEGATIVE

## 2016-01-21 MED ORDER — VITAMIN D (ERGOCALCIFEROL) 1.25 MG (50000 UNIT) PO CAPS: 50000.0000 [IU] | ORAL_CAPSULE | ORAL | 0 refills | Status: DC

## 2016-01-22 LAB — URINE CULTURE

## 2016-03-16 ENCOUNTER — Other Ambulatory Visit: Payer: Self-pay | Admitting: Women's Health

## 2016-03-16 DIAGNOSIS — Z1231 Encounter for screening mammogram for malignant neoplasm of breast: Secondary | ICD-10-CM

## 2016-04-07 ENCOUNTER — Ambulatory Visit
Admission: RE | Admit: 2016-04-07 | Discharge: 2016-04-07 | Disposition: A | Discharge status: Home / Self Care | Payer: 59 | Source: Ambulatory Visit | Attending: Women's Health | Admitting: Women's Health

## 2016-04-07 DIAGNOSIS — Z1231 Encounter for screening mammogram for malignant neoplasm of breast: Secondary | ICD-10-CM

## 2016-04-08 ENCOUNTER — Encounter: Payer: Self-pay | Admitting: Women's Health

## 2016-04-21 ENCOUNTER — Other Ambulatory Visit: Payer: 59

## 2016-04-21 DIAGNOSIS — E559 Vitamin D deficiency, unspecified: Secondary | ICD-10-CM

## 2016-04-22 ENCOUNTER — Encounter: Payer: Self-pay | Admitting: Women's Health

## 2016-04-22 LAB — VITAMIN D 25 HYDROXY (VIT D DEFICIENCY, FRACTURES): VIT D 25 HYDROXY: 30 ng/mL (ref 30–100)

## 2016-08-07 IMAGING — MG MM DIAG BREAST TOMO UNI RIGHT
4 series · 4 of 12 positions shown · non-contrast
Comparison: Previous exam(s).

CLINICAL DATA: Screening recall for a right breast mass.

EXAM:
DIGITAL DIAGNOSTIC RIGHT MAMMOGRAM WITH 3D TOMOSYNTHESIS AND CAD
RIGHT BREAST ULTRASOUND

[R MLO]
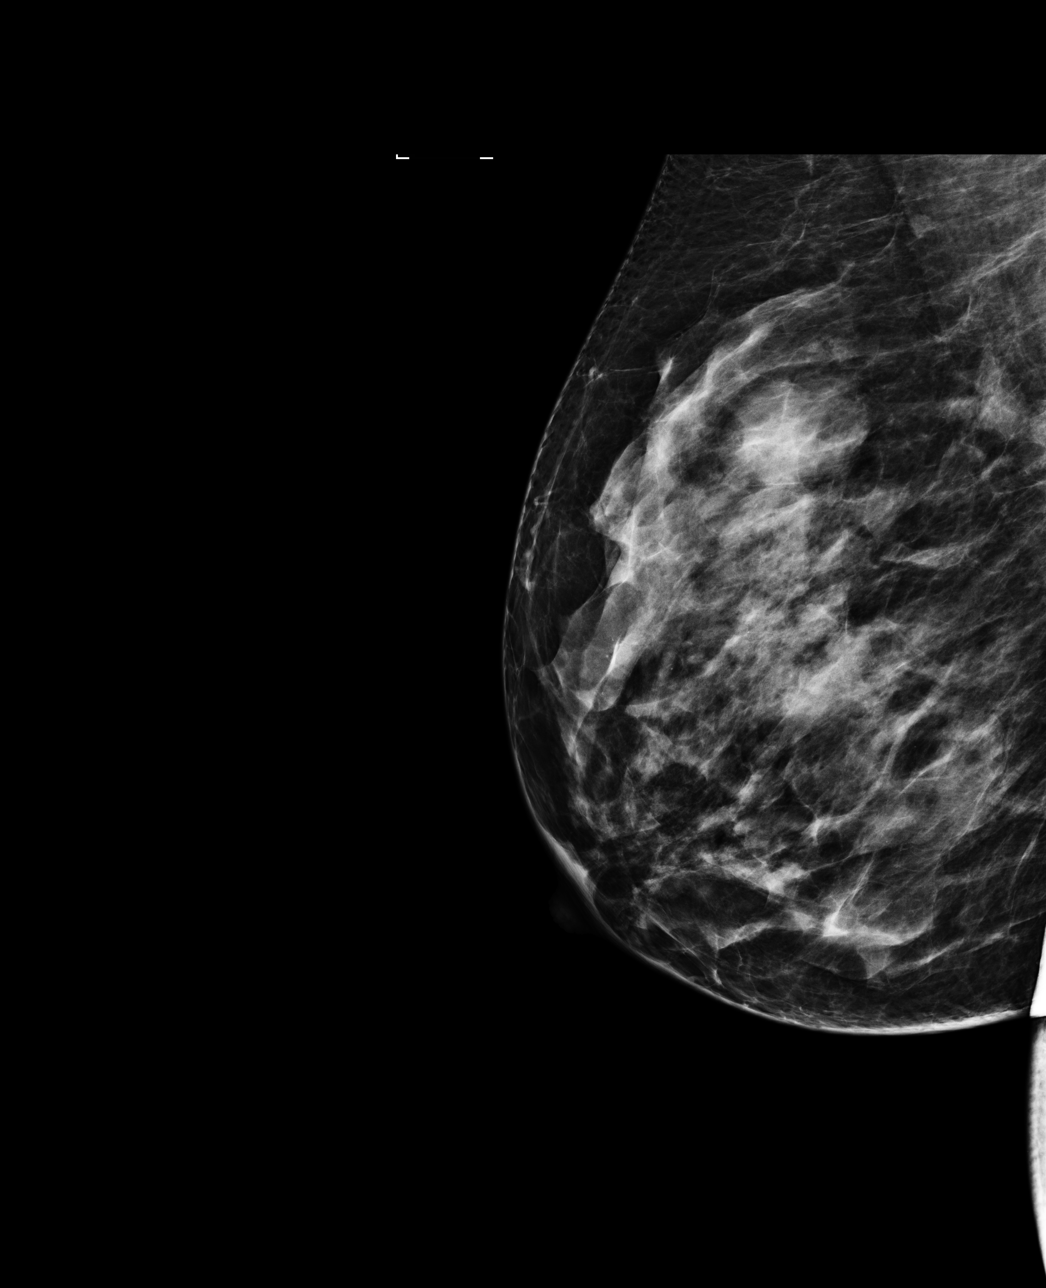

[R CC]
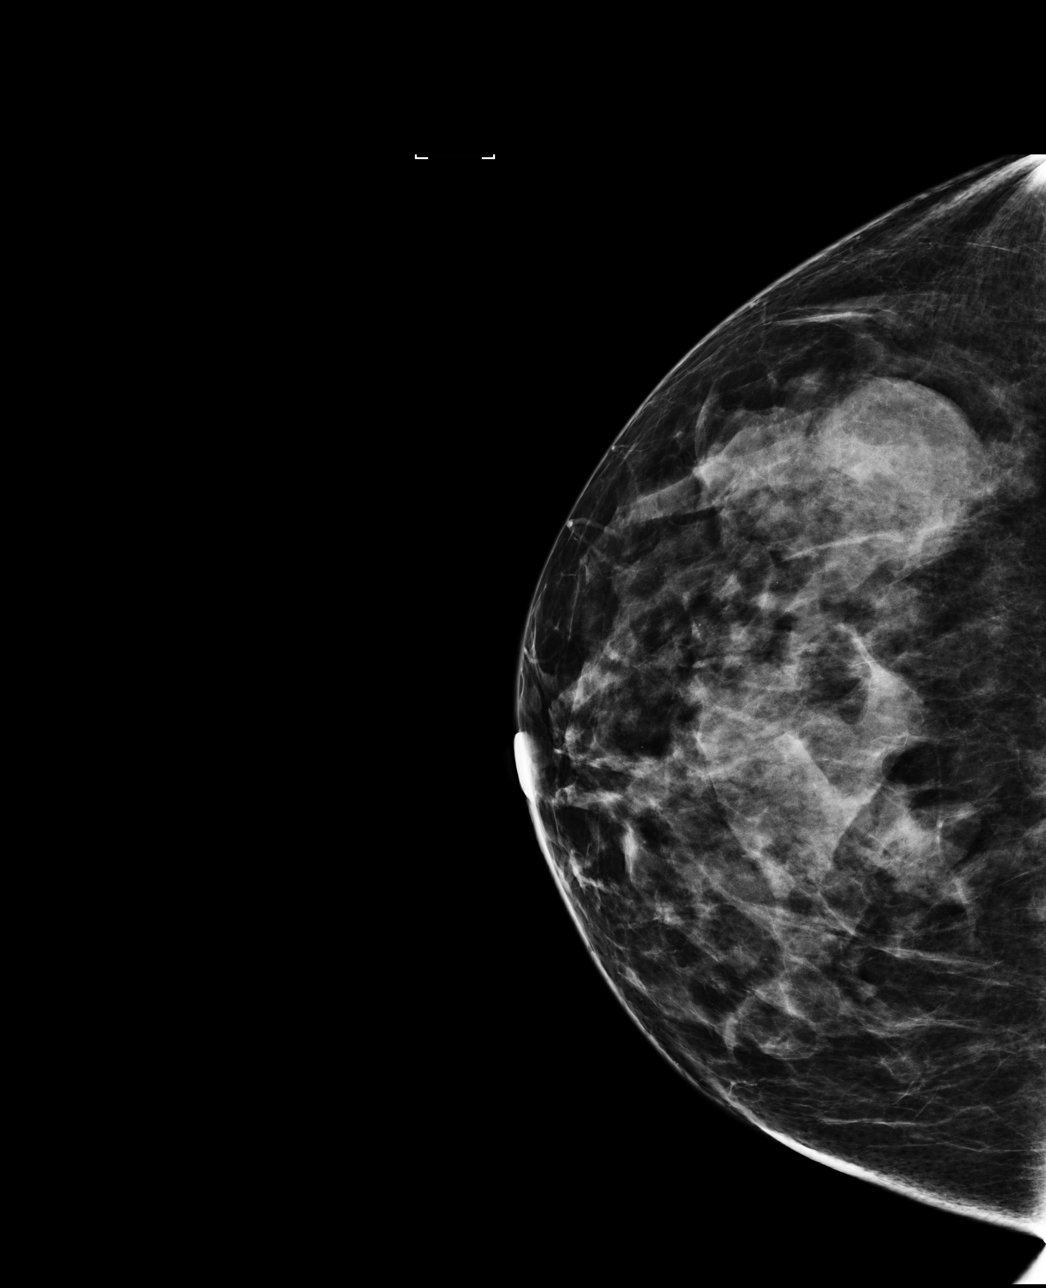

[R CC tomo · tomo slice 41/80.0]
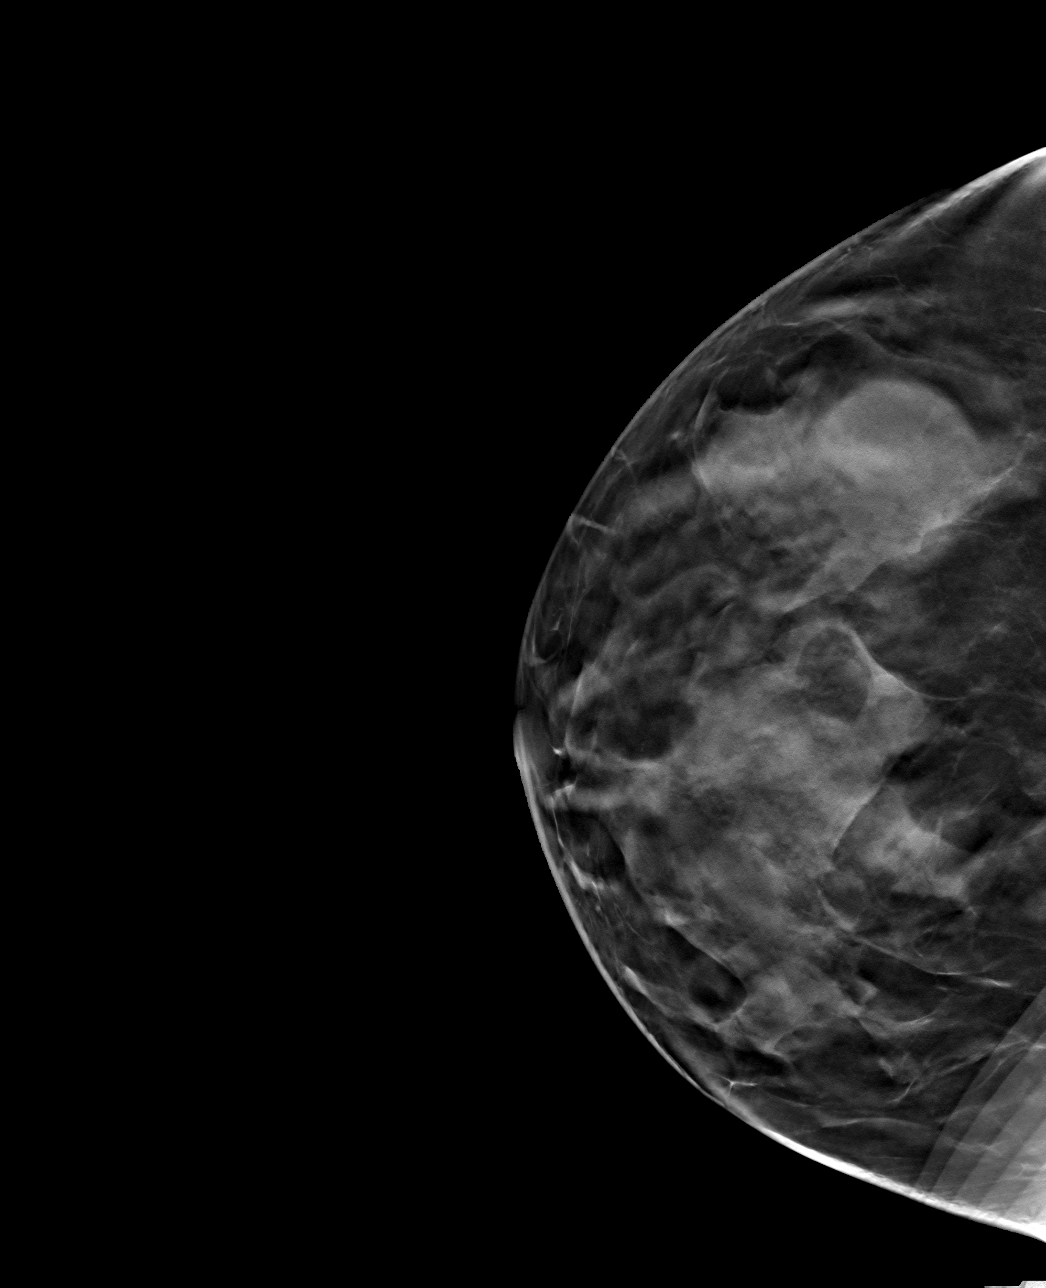

[R MLO tomo · tomo slice 45/90.0]
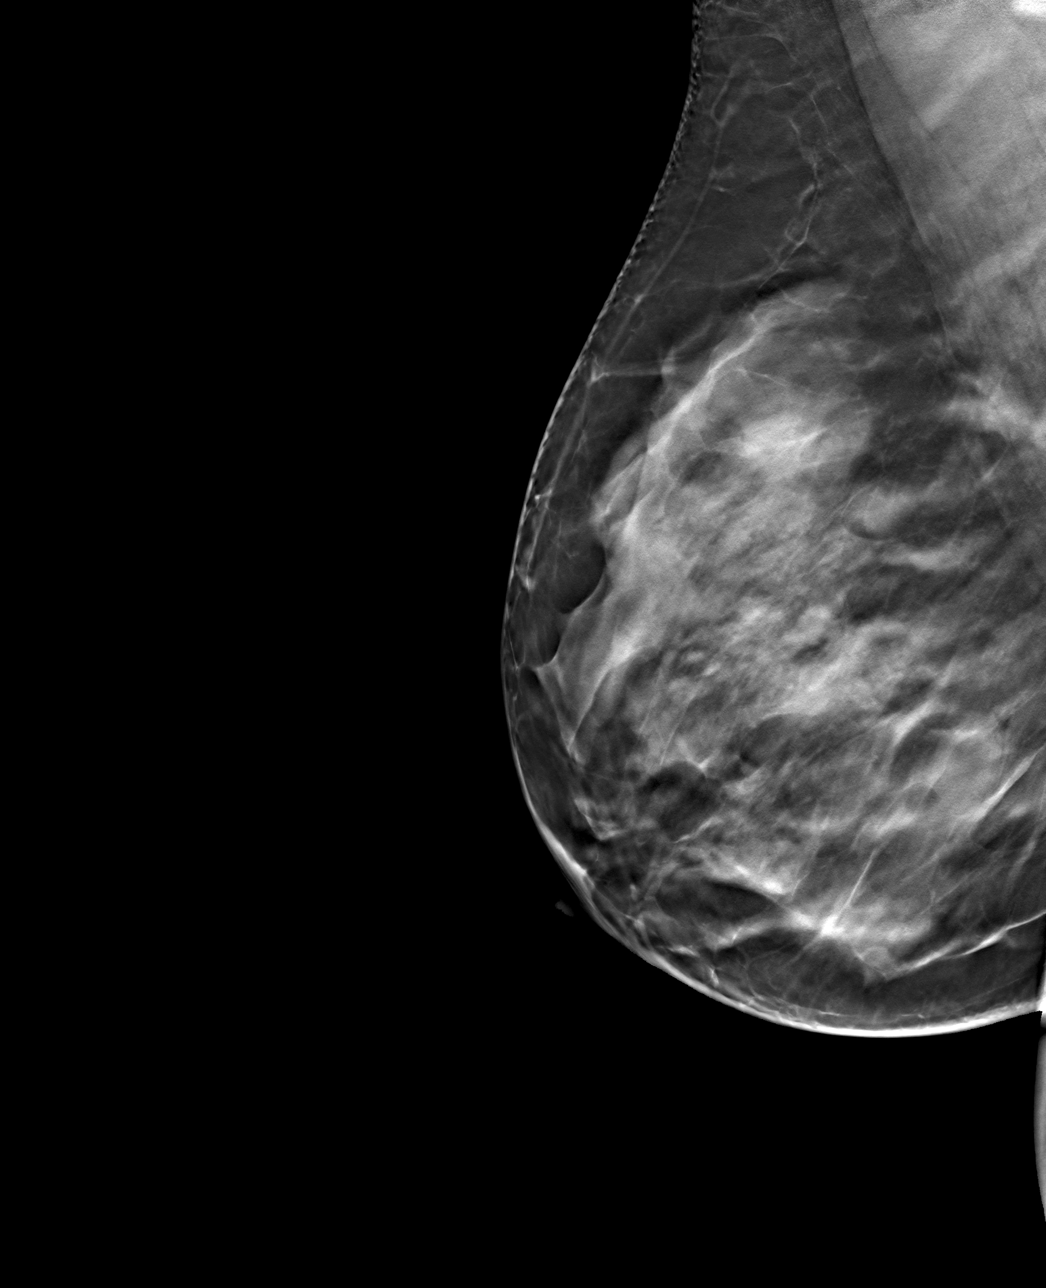

[4 of 12 positions shown; findings below may reference images not displayed]

ACR Breast Density Category c: The breast tissue is heterogeneously
dense, which may obscure small masses.
FINDINGS: In the upper-outer quadrant of the right breast, there is a
circumscribed oval mass measuring approximately 3.0 cm. This has
been present on prior exams, however has increased in size.

Mammographic images were processed with CAD.

Physical exam of the upper-outer quadrant of the right breast
demonstrates a firm smooth mobile mass in the upper-outer quadrant.

Ultrasound targeted to the palpable mass in the upper-outer right
breast at 10 o'clock 7 cm from the nipple demonstrates a
circumscribed anechoic oval mass with increased posterior acoustic
enhancement measuring 3.0 x 2.3 x 1.8 cm, compatible with a benign
cyst.
IMPRESSION: The enlarging mass of concern in the upper-outer right breast is
consistent with a benign cyst.

RECOMMENDATION:
Screening mammogram in one year.(Code:PP-V-RI5)

I have discussed the findings and recommendations with the patient.
Results were also provided in writing at the conclusion of the
visit. If applicable, a reminder letter will be sent to the patient
regarding the next appointment.

BI-RADS CATEGORY  2: Benign.

## 2017-01-20 ENCOUNTER — Encounter: Payer: 59 | Admitting: Women's Health

## 2017-01-23 ENCOUNTER — Ambulatory Visit (INDEPENDENT_AMBULATORY_CARE_PROVIDER_SITE_OTHER): Payer: BLUE CROSS/BLUE SHIELD | Admitting: Women's Health

## 2017-01-23 ENCOUNTER — Encounter: Payer: Self-pay | Admitting: Women's Health

## 2017-01-23 VITALS — BP 122/80 | Ht 65.0 in | Wt 217.0 lb

## 2017-01-23 DIAGNOSIS — E559 Vitamin D deficiency, unspecified: Secondary | ICD-10-CM

## 2017-01-23 DIAGNOSIS — R7309 Other abnormal glucose: Secondary | ICD-10-CM

## 2017-01-23 DIAGNOSIS — Z01419 Encounter for gynecological examination (general) (routine) without abnormal findings: Secondary | ICD-10-CM | POA: Diagnosis not present

## 2017-01-23 DIAGNOSIS — Z13220 Encounter for screening for lipoid disorders: Secondary | ICD-10-CM

## 2017-01-23 LAB — CBC WITH DIFFERENTIAL/PLATELET
BASOS ABS: 0 {cells}/uL (ref 0–200)
Basophils Relative: 0 %
EOS ABS: 47 {cells}/uL (ref 15–500)
Eosinophils Relative: 1 %
HEMATOCRIT: 40.5 % (ref 35.0–45.0)
HEMOGLOBIN: 13.1 g/dL (ref 11.7–15.5)
LYMPHS ABS: 1551 {cells}/uL (ref 850–3900)
Lymphocytes Relative: 33 %
MCH: 29.6 pg (ref 27.0–33.0)
MCHC: 32.3 g/dL (ref 32.0–36.0)
MCV: 91.6 fL (ref 80.0–100.0)
MONO ABS: 282 {cells}/uL (ref 200–950)
MPV: 12 fL (ref 7.5–12.5)
Monocytes Relative: 6 %
NEUTROS PCT: 60 %
Neutro Abs: 2820 cells/uL (ref 1500–7800)
Platelets: 192 10*3/uL (ref 140–400)
RBC: 4.42 MIL/uL (ref 3.80–5.10)
RDW: 13.9 % (ref 11.0–15.0)
WBC: 4.7 10*3/uL (ref 3.8–10.8)

## 2017-01-23 NOTE — Progress Notes (Signed)
[name] Jul 06, 1970 [id number]    History:    Presents for annual exam.  Monthly cycle/not sexually active. History of a 5 cm fundal fibroid. Normal Pap and mammogram history. 1993 negative breast biopsy. History of elevated glucose was seen by Dr. Chalmers Cater and was recommended dietary changes. Negative STD screen 2017, denies need for STD screening.  Past medical history, past surgical history, family history and social history were all reviewed and documented in the EPIC chart. Works at [employer]. Mother hypertension.  ROS:  A ROS was performed and pertinent positives and negatives are included.  Exam:  Vitals:   01/23/17 0816  BP: 122/80  Weight: 217 lb (98.4 kg)  Height: 5\' 5"  (1.651 m)   Body mass index is 36.11 kg/m.   General appearance:  Normal Thyroid:  Symmetrical, normal in size, without palpable masses or nodularity. Respiratory  Auscultation:  Clear without wheezing or rhonchi Cardiovascular  Auscultation:  Regular rate, without rubs, murmurs or gallops  Edema/varicosities:  Not grossly evident Abdominal  Soft,nontender, without masses, guarding or rebound.  Liver/spleen:  No organomegaly noted  Hernia:  None appreciated  Skin  Inspection:  Grossly normal   Breasts: Examined lying and sitting.     Right: Without masses, retractions, discharge or axillary adenopathy.     Left: Without masses, retractions, discharge or axillary adenopathy. Gentitourinary   Inguinal/mons:  Normal without inguinal adenopathy  External genitalia:  Normal  BUS/Urethra/Skene's glands:  Normal  Vagina:  Normal  Cervix:  Normal  Uterus:  normal in size, shape and contour.  Midline and mobile  Adnexa/parametria:     Rt: Without masses or tenderness.   Lt: Without masses or tenderness.  Anus and perineum: Normal  Digital rectal exam: Normal sphincter tone without palpated masses or tenderness  Assessment/Plan:  46 y.o. SBF G1 P0/ectopic  for annual exam with no complaints.  Monthly  cycle Fibroid uterus Obesity Diabetes-diet  Plan: SBE's, continue annual screening mammogram, calcium rich diet, vitamin D 2000 daily encouraged. Reviewed importance of increasing regular exercise, low carb diet and weight loss for diabetes prevention and health. Weight Watchers encouraged. Denies need for contraception management, condoms. CBC, lipid panel, CMP, hemoglobin A1c, vitamin D. Pap normal 2016, new screening guidelines reviewed.Huel Cote [facility], 9:28 AM 01/23/2017

## 2017-01-23 NOTE — Patient Instructions (Signed)
Health Maintenance, Female Adopting a healthy lifestyle and getting preventive care can go a long way to promote health and wellness. Talk with your health care provider about what schedule of regular examinations is right for you. This is a good chance for you to check in with your provider about disease prevention and staying healthy. In between checkups, there are plenty of things you can do on your own. Experts have done a lot of research about which lifestyle changes and preventive measures are most likely to keep you healthy. Ask your health care provider for more information. Weight and diet Eat a healthy diet  Be sure to include plenty of vegetables, fruits, low-fat dairy products, and lean protein.  Do not eat a lot of foods high in solid fats, added sugars, or salt.  Get regular exercise. This is one of the most important things you can do for your health. ? Most adults should exercise for at least 150 minutes each week. The exercise should increase your heart rate and make you sweat (moderate-intensity exercise). ? Most adults should also do strengthening exercises at least twice a week. This is in addition to the moderate-intensity exercise.  Maintain a healthy weight  Body mass index (BMI) is a measurement that can be used to identify possible weight problems. It estimates body fat based on height and weight. Your health care provider can help determine your BMI and help you achieve or maintain a healthy weight.  For females 20 years of age and older: ? A BMI below 18.5 is considered underweight. ? A BMI of 18.5 to 24.9 is normal. ? A BMI of 25 to 29.9 is considered overweight. ? A BMI of 30 and above is considered obese.  Watch levels of cholesterol and blood lipids  You should start having your blood tested for lipids and cholesterol at 45 years of age, then have this test every 5 years.  You may need to have your cholesterol levels checked more often if: ? Your lipid or  cholesterol levels are high. ? You are older than 45 years of age. ? You are at high risk for heart disease.  Cancer screening Lung Cancer  Lung cancer screening is recommended for adults 55-80 years old who are at high risk for lung cancer because of a history of smoking.  A yearly low-dose CT scan of the lungs is recommended for people who: ? Currently smoke. ? Have quit within the past 15 years. ? Have at least a 30-pack-year history of smoking. A pack year is smoking an average of one pack of cigarettes a day for 1 year.  Yearly screening should continue until it has been 15 years since you quit.  Yearly screening should stop if you develop a health problem that would prevent you from having lung cancer treatment.  Breast Cancer  Practice breast self-awareness. This means understanding how your breasts normally appear and feel.  It also means doing regular breast self-exams. Let your health care provider know about any changes, no matter how small.  If you are in your 20s or 30s, you should have a clinical breast exam (CBE) by a health care provider every 1-3 years as part of a regular health exam.  If you are 40 or older, have a CBE every year. Also consider having a breast X-ray (mammogram) every year.  If you have a family history of breast cancer, talk to your health care provider about genetic screening.  If you are at high risk   for breast cancer, talk to your health care provider about having an MRI and a mammogram every year.  Breast cancer gene (BRCA) assessment is recommended for women who have family members with BRCA-related cancers. BRCA-related cancers include: ? Breast. ? Ovarian. ? Tubal. ? Peritoneal cancers.  Results of the assessment will determine the need for genetic counseling and BRCA1 and BRCA2 testing.  Cervical Cancer Your health care provider may recommend that you be screened regularly for cancer of the pelvic organs (ovaries, uterus, and  vagina). This screening involves a pelvic examination, including checking for microscopic changes to the surface of your cervix (Pap test). You may be encouraged to have this screening done every 3 years, beginning at age 22.  For women ages 56-65, health care providers may recommend pelvic exams and Pap testing every 3 years, or they may recommend the Pap and pelvic exam, combined with testing for human papilloma virus (HPV), every 5 years. Some types of HPV increase your risk of cervical cancer. Testing for HPV may also be done on women of any age with unclear Pap test results.  Other health care providers may not recommend any screening for nonpregnant women who are considered low risk for pelvic cancer and who do not have symptoms. Ask your health care provider if a screening pelvic exam is right for you.  If you have had past treatment for cervical cancer or a condition that could lead to cancer, you need Pap tests and screening for cancer for at least 20 years after your treatment. If Pap tests have been discontinued, your risk factors (such as having a new sexual partner) need to be reassessed to determine if screening should resume. Some women have medical problems that increase the chance of getting cervical cancer. In these cases, your health care provider may recommend more frequent screening and Pap tests.  Colorectal Cancer  This type of cancer can be detected and often prevented.  Routine colorectal cancer screening usually begins at 45 years of age and continues through 45 years of age.  Your health care provider may recommend screening at an earlier age if you have risk factors for colon cancer.  Your health care provider may also recommend using home test kits to check for hidden blood in the stool.  A small camera at the end of a tube can be used to examine your colon directly (sigmoidoscopy or colonoscopy). This is done to check for the earliest forms of colorectal  cancer.  Routine screening usually begins at age 33.  Direct examination of the colon should be repeated every 5-10 years through 45 years of age. However, you may need to be screened more often if early forms of precancerous polyps or small growths are found.  Skin Cancer  Check your skin from head to toe regularly.  Tell your health care provider about any new moles or changes in moles, especially if there is a change in a mole's shape or color.  Also tell your health care provider if you have a mole that is larger than the size of a pencil eraser.  Always use sunscreen. Apply sunscreen liberally and repeatedly throughout the day.  Protect yourself by wearing long sleeves, pants, a wide-brimmed hat, and sunglasses whenever you are outside.  Heart disease, diabetes, and high blood pressure  High blood pressure causes heart disease and increases the risk of stroke. High blood pressure is more likely to develop in: ? People who have blood pressure in the high end of  the normal range (130-139/85-89 mm Hg). ? People who are overweight or obese. ? People who are African American.  If you are 21-29 years of age, have your blood pressure checked every 3-5 years. If you are 3 years of age or older, have your blood pressure checked every year. You should have your blood pressure measured twice-once when you are at a hospital or clinic, and once when you are not at a hospital or clinic. Record the average of the two measurements. To check your blood pressure when you are not at a hospital or clinic, you can use: ? An automated blood pressure machine at a pharmacy. ? A home blood pressure monitor.  If you are between 17 years and 37 years old, ask your health care provider if you should take aspirin to prevent strokes.  Have regular diabetes screenings. This involves taking a blood sample to check your fasting blood sugar level. ? If you are at a normal weight and have a low risk for diabetes,  have this test once every three years after 45 years of age. ? If you are overweight and have a high risk for diabetes, consider being tested at a younger age or more often. Preventing infection Hepatitis B  If you have a higher risk for hepatitis B, you should be screened for this virus. You are considered at high risk for hepatitis B if: ? You were born in a country where hepatitis B is common. Ask your health care provider which countries are considered high risk. ? Your parents were born in a high-risk country, and you have not been immunized against hepatitis B (hepatitis B vaccine). ? You have HIV or AIDS. ? You use needles to inject street drugs. ? You live with someone who has hepatitis B. ? You have had sex with someone who has hepatitis B. ? You get hemodialysis treatment. ? You take certain medicines for conditions, including cancer, organ transplantation, and autoimmune conditions.  Hepatitis C  Blood testing is recommended for: ? Everyone born from [birth year] through 1964. ? Anyone with known risk factors for hepatitis C.  Sexually transmitted infections (STIs)  You should be screened for sexually transmitted infections (STIs) including gonorrhea and chlamydia if: ? You are sexually active and are younger than 45 years of age. ? You are older than 45 years of age and your health care provider tells you that you are at risk for this type of infection. ? Your sexual activity has changed since you were last screened and you are at an increased risk for chlamydia or gonorrhea. Ask your health care provider if you are at risk.  If you do not have HIV, but are at risk, it may be recommended that you take a prescription medicine daily to prevent HIV infection. This is called pre-exposure prophylaxis (PrEP). You are considered at risk if: ? You are sexually active and do not regularly use condoms or know the HIV status of your partner(s). ? You take drugs by injection. ? You are  sexually active with a partner who has HIV.  Talk with your health care provider about whether you are at high risk of being infected with HIV. If you choose to begin PrEP, you should first be tested for HIV. You should then be tested every 3 months for as long as you are taking PrEP. Pregnancy  If you are premenopausal and you may become pregnant, ask your health care provider about preconception counseling.  If you may become  pregnant, take 400 to 800 micrograms (mcg) of folic acid every day.  If you want to prevent pregnancy, talk to your health care provider about birth control (contraception). Osteoporosis and menopause  Osteoporosis is a disease in which the bones lose minerals and strength with aging. This can result in serious bone fractures. Your risk for osteoporosis can be identified using a bone density scan.  If you are 65 years of age or older, or if you are at risk for osteoporosis and fractures, ask your health care provider if you should be screened.  Ask your health care provider whether you should take a calcium or vitamin D supplement to lower your risk for osteoporosis.  Menopause may have certain physical symptoms and risks.  Hormone replacement therapy may reduce some of these symptoms and risks. Talk to your health care provider about whether hormone replacement therapy is right for you. Follow these instructions at home:  Schedule regular health, dental, and eye exams.  Stay current with your immunizations.  Do not use any tobacco products including cigarettes, chewing tobacco, or electronic cigarettes.  If you are pregnant, do not drink alcohol.  If you are breastfeeding, limit how much and how often you drink alcohol.  Limit alcohol intake to no more than 1 drink per day for nonpregnant women. One drink equals 12 ounces of beer, 5 ounces of wine, or 1 ounces of hard liquor.  Do not use street drugs.  Do not share needles.  Ask your health care  provider for help if you need support or information about quitting drugs.  Tell your health care provider if you often feel depressed.  Tell your health care provider if you have ever been abused or do not feel safe at home. This information is not intended to replace advice given to you by your health care provider. Make sure you discuss any questions you have with your health care provider. Document Released: 12/06/2010 Document Revised: 10/29/2015 Document Reviewed: 02/24/2015 Elsevier Interactive Patient Education  2018 [employer] Carbohydrate Counting for Diabetes Mellitus, Adult Carbohydrate counting is a method for keeping track of how many carbohydrates you eat. Eating carbohydrates naturally increases the amount of sugar (glucose) in the blood. Counting how many carbohydrates you eat helps keep your blood glucose within normal limits, which helps you manage your diabetes (diabetes mellitus). It is important to know how many carbohydrates you can safely have in each meal. This is different for every person. A diet and nutrition specialist (registered dietitian) can help you make a meal plan and calculate how many carbohydrates you should have at each meal and snack. Carbohydrates are found in the following foods:  Grains, such as breads and cereals.  Dried beans and soy products.  Starchy vegetables, such as potatoes, peas, and corn.  Fruit and fruit juices.  Milk and yogurt.  Sweets and snack foods, such as cake, cookies, candy, chips, and soft drinks.  How do I count carbohydrates? There are two ways to count carbohydrates in food. You can use either of the methods or a combination of both. Reading "Nutrition Facts" on packaged food The "Nutrition Facts" list is included on the labels of almost all packaged foods and beverages in the [place] It includes:  The serving size.  Information about nutrients in each serving, including the grams (g) of carbohydrate per  serving.  To use the "Nutrition Facts":  Decide how many servings you will have.  Multiply the number of servings by the number of carbohydrates   per serving.  The resulting number is the total amount of carbohydrates that you will be having.  Learning standard serving sizes of other foods When you eat foods containing carbohydrates that are not packaged or do not include "Nutrition Facts" on the label, you need to measure the servings in order to count the amount of carbohydrates:  Measure the foods that you will eat with a food scale or measuring cup, if needed.  Decide how many standard-size servings you will eat.  Multiply the number of servings by 15. Most carbohydrate-rich foods have about 15 g of carbohydrates per serving. ? For example, if you eat 8 oz (170 g) of strawberries, you will have eaten 2 servings and 30 g of carbohydrates (2 servings x 15 g = 30 g).  For foods that have more than one food mixed, such as soups and casseroles, you must count the carbohydrates in each food that is included.  The following list contains standard serving sizes of common carbohydrate-rich foods. Each of these servings has about 15 g of carbohydrates:   hamburger bun or  English muffin.   oz (15 mL) syrup.   oz (14 g) jelly.  1 slice of bread.  1 six-inch tortilla.  3 oz (85 g) cooked rice or pasta.  4 oz (113 g) cooked dried beans.  4 oz (113 g) starchy vegetable, such as peas, corn, or potatoes.  4 oz (113 g) hot cereal.  4 oz (113 g) mashed potatoes or  of a large baked potato.  4 oz (113 g) canned or frozen fruit.  4 oz (120 mL) fruit juice.  4-6 crackers.  6 chicken nuggets.  6 oz (170 g) unsweetened dry cereal.  6 oz (170 g) plain fat-free yogurt or yogurt sweetened with artificial sweeteners.  8 oz (240 mL) milk.  8 oz (170 g) fresh fruit or one small piece of fruit.  24 oz (680 g) popped popcorn.  Example of carbohydrate counting Sample meal  3  oz (85 g) chicken breast.  6 oz (170 g) brown rice.  4 oz (113 g) corn.  8 oz (240 mL) milk.  8 oz (170 g) strawberries with sugar-free whipped topping. Carbohydrate calculation 1. Identify the foods that contain carbohydrates: ? Rice. ? Corn. ? Milk. ? Strawberries. 2. Calculate how many servings you have of each food: ? 2 servings rice. ? 1 serving corn. ? 1 serving milk. ? 1 serving strawberries. 3. Multiply each number of servings by 15 g: ? 2 servings rice x 15 g = 30 g. ? 1 serving corn x 15 g = 15 g. ? 1 serving milk x 15 g = 15 g. ? 1 serving strawberries x 15 g = 15 g. 4. Add together all of the amounts to find the total grams of carbohydrates eaten: ? 30 g + 15 g + 15 g + 15 g = 75 g of carbohydrates total. This information is not intended to replace advice given to you by your health care provider. Make sure you discuss any questions you have with your health care provider. Document Released: 05/23/2005 Document Revised: 12/11/2015 Document Reviewed: 11/04/2015 [employer]vier Interactive Patient Education  [employer].

## 2017-01-24 LAB — COMPREHENSIVE METABOLIC PANEL
ALBUMIN: 4.4 g/dL (ref 3.6–5.1)
ALK PHOS: 55 U/L (ref 33–115)
ALT: 32 U/L — AB (ref 6–29)
AST: 23 U/L (ref 10–35)
BUN: 12 mg/dL (ref 7–25)
CALCIUM: 9.1 mg/dL (ref 8.6–10.2)
CO2: 24 mmol/L (ref 20–32)
Chloride: 104 mmol/L (ref 98–110)
Creat: 0.73 mg/dL (ref 0.50–1.10)
GLUCOSE: 107 mg/dL — AB (ref 65–99)
POTASSIUM: 3.9 mmol/L (ref 3.5–5.3)
Sodium: 140 mmol/L (ref 135–146)
TOTAL PROTEIN: 7.1 g/dL (ref 6.1–8.1)
Total Bilirubin: 0.8 mg/dL (ref 0.2–1.2)

## 2017-01-24 LAB — URINALYSIS W MICROSCOPIC + REFLEX CULTURE
BACTERIA UA: NONE SEEN [HPF]
Bilirubin Urine: NEGATIVE
CASTS: NONE SEEN [LPF]
Glucose, UA: NEGATIVE
HGB URINE DIPSTICK: NEGATIVE
KETONES UR: NEGATIVE
Leukocytes, UA: NEGATIVE
Nitrite: NEGATIVE
Protein, ur: NEGATIVE
RBC / HPF: NONE SEEN RBC/HPF (ref <=2)
SQUAMOUS EPITHELIAL / LPF: NONE SEEN [HPF] (ref <=5)
Specific Gravity, Urine: 1.025 (ref 1.001–1.035)
WBC, UA: NONE SEEN WBC/HPF (ref <=5)
YEAST: NONE SEEN [HPF]
pH: 5.5 (ref 5.0–8.0)

## 2017-01-24 LAB — HEMOGLOBIN A1C
HEMOGLOBIN A1C: 6 % — AB (ref <5.7)
MEAN PLASMA GLUCOSE: 126 mg/dL

## 2017-01-24 LAB — LIPID PANEL
Cholesterol: 144 mg/dL (ref <200)
HDL: 45 mg/dL — ABNORMAL LOW (ref >50)
LDL CALC: 83 mg/dL (ref <100)
TRIGLYCERIDES: 81 mg/dL (ref <150)
Total CHOL/HDL Ratio: 3.2 Ratio (ref <5.0)
VLDL: 16 mg/dL (ref <30)

## 2017-01-24 LAB — VITAMIN D 25 HYDROXY (VIT D DEFICIENCY, FRACTURES): VIT D 25 HYDROXY: 38 ng/mL (ref 30–100)

## 2017-02-10 ENCOUNTER — Other Ambulatory Visit: Payer: Self-pay | Admitting: Women's Health

## 2017-02-10 DIAGNOSIS — R7309 Other abnormal glucose: Secondary | ICD-10-CM

## 2017-03-08 ENCOUNTER — Other Ambulatory Visit: Payer: Self-pay | Admitting: Women's Health

## 2017-03-08 DIAGNOSIS — Z1231 Encounter for screening mammogram for malignant neoplasm of breast: Secondary | ICD-10-CM

## 2017-04-10 ENCOUNTER — Ambulatory Visit: Payer: 59

## 2017-04-21 ENCOUNTER — Encounter: Payer: Self-pay | Admitting: Women's Health

## 2017-04-21 ENCOUNTER — Ambulatory Visit
Admission: RE | Admit: 2017-04-21 | Discharge: 2017-04-21 | Disposition: A | Discharge status: Home / Self Care | Payer: 59 | Source: Ambulatory Visit | Attending: Women's Health | Admitting: Women's Health

## 2017-04-21 DIAGNOSIS — Z1231 Encounter for screening mammogram for malignant neoplasm of breast: Secondary | ICD-10-CM

## 2017-06-28 ENCOUNTER — Other Ambulatory Visit: Payer: BLUE CROSS/BLUE SHIELD

## 2017-06-28 DIAGNOSIS — R7309 Other abnormal glucose: Secondary | ICD-10-CM

## 2017-06-29 LAB — HEMOGLOBIN A1C
EAG (MMOL/L): 7 (calc)
HEMOGLOBIN A1C: 6 %{Hb} — AB (ref <5.7)
MEAN PLASMA GLUCOSE: 126 (calc)

## 2018-01-24 ENCOUNTER — Encounter: Payer: Self-pay | Admitting: Women's Health

## 2018-01-24 ENCOUNTER — Ambulatory Visit (INDEPENDENT_AMBULATORY_CARE_PROVIDER_SITE_OTHER): Payer: BLUE CROSS/BLUE SHIELD | Admitting: Women's Health

## 2018-01-24 VITALS — BP 128/80 | Ht 65.0 in | Wt 223.0 lb

## 2018-01-24 DIAGNOSIS — Z113 Encounter for screening for infections with a predominantly sexual mode of transmission: Secondary | ICD-10-CM

## 2018-01-24 DIAGNOSIS — R7309 Other abnormal glucose: Secondary | ICD-10-CM

## 2018-01-24 DIAGNOSIS — Z01419 Encounter for gynecological examination (general) (routine) without abnormal findings: Secondary | ICD-10-CM

## 2018-01-24 NOTE — Patient Instructions (Addendum)
Health Maintenance for Postmenopausal Women Menopause is a normal process in which your reproductive ability comes to an end. This process happens gradually over a span of months to years, usually between the ages of 22 and 9. Menopause is complete when you have missed 12 consecutive menstrual periods. It is important to talk with your health care provider about some of the most common conditions that affect postmenopausal women, such as heart disease, cancer, and bone loss (osteoporosis). Adopting a healthy lifestyle and getting preventive care can help to promote your health and wellness. Those actions can also lower your chances of developing some of these common conditions. What should I know about menopause? During menopause, you may experience a number of symptoms, such as:  Moderate-to-severe hot flashes.  Night sweats.  Decrease in sex drive.  Mood swings.  Headaches.  Tiredness.  Irritability.  Memory problems.  Insomnia.  Choosing to treat or not to treat menopausal changes is an individual decision that you make with your health care provider. What should I know about hormone replacement therapy and supplements? Hormone therapy products are effective for treating symptoms that are associated with menopause, such as hot flashes and night sweats. Hormone replacement carries certain risks, especially as you become older. If you are thinking about using estrogen or estrogen with progestin treatments, discuss the benefits and risks with your health care provider. What should I know about heart disease and stroke? Heart disease, heart attack, and stroke become more likely as you age. This may be due, in part, to the hormonal changes that your body experiences during menopause. These can affect how your body processes dietary fats, triglycerides, and cholesterol. Heart attack and stroke are both medical emergencies. There are many things that you can do to help prevent heart disease  and stroke:  Have your blood pressure checked at least every 1-2 years. High blood pressure causes heart disease and increases the risk of stroke.  If you are 53-22 years old, ask your health care provider if you should take aspirin to prevent a heart attack or a stroke.  Do not use any tobacco products, including cigarettes, chewing tobacco, or electronic cigarettes. If you need help quitting, ask your health care provider.  It is important to eat a healthy diet and maintain a healthy weight. ? Be sure to include plenty of vegetables, fruits, low-fat dairy products, and lean protein. ? Avoid eating foods that are high in solid fats, added sugars, or salt (sodium).  Get regular exercise. This is one of the most important things that you can do for your health. ? Try to exercise for at least 150 minutes each week. The type of exercise that you do should increase your heart rate and make you sweat. This is known as moderate-intensity exercise. ? Try to do strengthening exercises at least twice each week. Do these in addition to the moderate-intensity exercise.  Know your numbers.Ask your health care provider to check your cholesterol and your blood glucose. Continue to have your blood tested as directed by your health care provider.  What should I know about cancer screening? There are several types of cancer. Take the following steps to reduce your risk and to catch any cancer development as early as possible. Breast Cancer  Practice breast self-awareness. ? This means understanding how your breasts normally appear and feel. ? It also means doing regular breast self-exams. Let your health care provider know about any changes, no matter how small.  If you are 40  or older, have a clinician do a breast exam (clinical breast exam or CBE) every year. Depending on your age, family history, and medical history, it may be recommended that you also have a yearly breast X-ray (mammogram).  If you  have a family history of breast cancer, talk with your health care provider about genetic screening.  If you are at high risk for breast cancer, talk with your health care provider about having an MRI and a mammogram every year.  Breast cancer (BRCA) gene test is recommended for women who have family members with BRCA-related cancers. Results of the assessment will determine the need for genetic counseling and BRCA1 and for BRCA2 testing. BRCA-related cancers include these types: ? Breast. This occurs in males or females. ? Ovarian. ? Tubal. This may also be called fallopian tube cancer. ? Cancer of the abdominal or pelvic lining (peritoneal cancer). ? Prostate. ? Pancreatic.  Cervical, Uterine, and Ovarian Cancer Your health care provider may recommend that you be screened regularly for cancer of the pelvic organs. These include your ovaries, uterus, and vagina. This screening involves a pelvic exam, which includes checking for microscopic changes to the surface of your cervix (Pap test).  For women ages 21-65, health care providers may recommend a pelvic exam and a Pap test every three years. For women ages 79-65, they may recommend the Pap test and pelvic exam, combined with testing for human papilloma virus (HPV), every five years. Some types of HPV increase your risk of cervical cancer. Testing for HPV may also be done on women of any age who have unclear Pap test results.  Other health care providers may not recommend any screening for nonpregnant women who are considered low risk for pelvic cancer and have no symptoms. Ask your health care provider if a screening pelvic exam is right for you.  If you have had past treatment for cervical cancer or a condition that could lead to cancer, you need Pap tests and screening for cancer for at least 20 years after your treatment. If Pap tests have been discontinued for you, your risk factors (such as having a new sexual partner) need to be  reassessed to determine if you should start having screenings again. Some women have medical problems that increase the chance of getting cervical cancer. In these cases, your health care provider may recommend that you have screening and Pap tests more often.  If you have a family history of uterine cancer or ovarian cancer, talk with your health care provider about genetic screening.  If you have vaginal bleeding after reaching menopause, tell your health care provider.  There are currently no reliable tests available to screen for ovarian cancer.  Lung Cancer Lung cancer screening is recommended for adults 69-62 years old who are at high risk for lung cancer because of a history of smoking. A yearly low-dose CT scan of the lungs is recommended if you:  Currently smoke.  Have a history of at least 30 pack-years of smoking and you currently smoke or have quit within the past 15 years. A pack-year is smoking an average of one pack of cigarettes per day for one year.  Yearly screening should:  Continue until it has been 15 years since you quit.  Stop if you develop a health problem that would prevent you from having lung cancer treatment.  Colorectal Cancer  This type of cancer can be detected and can often be prevented.  Routine colorectal cancer screening usually begins at  age 42 and continues through age 45.  If you have risk factors for colon cancer, your health care provider may recommend that you be screened at an earlier age.  If you have a family history of colorectal cancer, talk with your health care provider about genetic screening.  Your health care provider may also recommend using home test kits to check for hidden blood in your stool.  A small camera at the end of a tube can be used to examine your colon directly (sigmoidoscopy or colonoscopy). This is done to check for the earliest forms of colorectal cancer.  Direct examination of the colon should be repeated every  5-10 years until age 71. However, if early forms of precancerous polyps or small growths are found or if you have a family history or genetic risk for colorectal cancer, you may need to be screened more often.  Skin Cancer  Check your skin from head to toe regularly.  Monitor any moles. Be sure to tell your health care provider: ? About any new moles or changes in moles, especially if there is a change in a mole's shape or color. ? If you have a mole that is larger than the size of a pencil eraser.  If any of your family members has a history of skin cancer, especially at a young age, talk with your health care provider about genetic screening.  Always use sunscreen. Apply sunscreen liberally and repeatedly throughout the day.  Whenever you are outside, protect yourself by wearing long sleeves, pants, a wide-brimmed hat, and sunglasses.  What should I know about osteoporosis? Osteoporosis is a condition in which bone destruction happens more quickly than new bone creation. After menopause, you may be at an increased risk for osteoporosis. To help prevent osteoporosis or the bone fractures that can happen because of osteoporosis, the following is recommended:  If you are 46-71 years old, get at least 1,000 mg of calcium and at least 600 mg of vitamin D per day.  If you are older than age 55 but younger than age 65, get at least 1,200 mg of calcium and at least 600 mg of vitamin D per day.  If you are older than age 54, get at least 1,200 mg of calcium and at least 800 mg of vitamin D per day.  Smoking and excessive alcohol intake increase the risk of osteoporosis. Eat foods that are rich in calcium and vitamin D, and do weight-bearing exercises several times each week as directed by your health care provider. What should I know about how menopause affects my mental health? Depression may occur at any age, but it is more common as you become older. Common symptoms of depression  include:  Low or sad mood.  Changes in sleep patterns.  Changes in appetite or eating patterns.  Feeling an overall lack of motivation or enjoyment of activities that you previously enjoyed.  Frequent crying spells.  Talk with your health care provider if you think that you are experiencing depression. What should I know about immunizations? It is important that you get and maintain your immunizations. These include:  Tetanus, diphtheria, and pertussis (Tdap) booster vaccine.  Influenza every year before the flu season begins.  Pneumonia vaccine.  Shingles vaccine.  Your health care provider may also recommend other immunizations. This information is not intended to replace advice given to you by your health care provider. Make sure you discuss any questions you have with your health care provider. Document Released: 07/15/2005  Document Revised: 12/11/2015 Document Reviewed: 02/24/2015 Elsevier Interactive Patient Education  2018 [employer].  Carbohydrate Counting for Diabetes Mellitus, Adult Carbohydrate counting is a method for keeping track of how many carbohydrates you eat. Eating carbohydrates naturally increases the amount of sugar (glucose) in the blood. Counting how many carbohydrates you eat helps keep your blood glucose within normal limits, which helps you manage your diabetes (diabetes mellitus). It is important to know how many carbohydrates you can safely have in each meal. This is different for every person. A diet and nutrition specialist (registered dietitian) can help you make a meal plan and calculate how many carbohydrates you should have at each meal and snack. Carbohydrates are found in the following foods:  Grains, such as breads and cereals.  Dried beans and soy products.  Starchy vegetables, such as potatoes, peas, and corn.  Fruit and fruit juices.  Milk and yogurt.  Sweets and snack foods, such as cake, cookies, candy, chips, and soft  drinks.  How do I count carbohydrates? There are two ways to count carbohydrates in food. You can use either of the methods or a combination of both. Reading "Nutrition Facts" on packaged food The "Nutrition Facts" list is included on the labels of almost all packaged foods and beverages in the [place] It includes:  The serving size.  Information about nutrients in each serving, including the grams (g) of carbohydrate per serving.  To use the "Nutrition Facts":  Decide how many servings you will have.  Multiply the number of servings by the number of carbohydrates per serving.  The resulting number is the total amount of carbohydrates that you will be having.  Learning standard serving sizes of other foods When you eat foods containing carbohydrates that are not packaged or do not include "Nutrition Facts" on the label, you need to measure the servings in order to count the amount of carbohydrates:  Measure the foods that you will eat with a food scale or measuring cup, if needed.  Decide how many standard-size servings you will eat.  Multiply the number of servings by 15. Most carbohydrate-rich foods have about 15 g of carbohydrates per serving. ? For example, if you eat 8 oz (170 g) of strawberries, you will have eaten 2 servings and 30 g of carbohydrates (2 servings x 15 g = 30 g).  For foods that have more than one food mixed, such as soups and casseroles, you must count the carbohydrates in each food that is included.  The following list contains standard serving sizes of common carbohydrate-rich foods. Each of these servings has about 15 g of carbohydrates:   hamburger bun or  English muffin.   oz (15 mL) syrup.   oz (14 g) jelly.  1 slice of bread.  1 six-inch tortilla.  3 oz (85 g) cooked rice or pasta.  4 oz (113 g) cooked dried beans.  4 oz (113 g) starchy vegetable, such as peas, corn, or potatoes.  4 oz (113 g) hot cereal.  4 oz (113 g) mashed potatoes  or  of a large baked potato.  4 oz (113 g) canned or frozen fruit.  4 oz (120 mL) fruit juice.  4-6 crackers.  6 chicken nuggets.  6 oz (170 g) unsweetened dry cereal.  6 oz (170 g) plain fat-free yogurt or yogurt sweetened with artificial sweeteners.  8 oz (240 mL) milk.  8 oz (170 g) fresh fruit or one small piece of fruit.  24 oz (680 g)  popcorn.  Example of carbohydrate counting Sample meal  3 oz (85 g) chicken breast.  6 oz (170 g) brown rice.  4 oz (113 g) corn.  8 oz (240 mL) milk.  8 oz (170 g) strawberries with sugar-free whipped topping. Carbohydrate calculation 1. Identify the foods that contain carbohydrates: ? Rice. ? Corn. ? Milk. ? Strawberries. 2. Calculate how many servings you have of each food: ? 2 servings rice. ? 1 serving corn. ? 1 serving milk. ? 1 serving strawberries. 3. Multiply each number of servings by 15 g: ? 2 servings rice x 15 g = 30 g. ? 1 serving corn x 15 g = 15 g. ? 1 serving milk x 15 g = 15 g. ? 1 serving strawberries x 15 g = 15 g. 4. Add together all of the amounts to find the total grams of carbohydrates eaten: ? 30 g + 15 g + 15 g + 15 g = 75 g of carbohydrates total. This information is not intended to replace advice given to you by your health care provider. Make sure you discuss any questions you have with your health care provider. Document Released: 05/23/2005 Document Revised: 12/11/2015 Document Reviewed: 11/04/2015 Elsevier Interactive Patient Education  2018 [employer]  

## 2018-01-24 NOTE — Progress Notes (Signed)
[name] 01/26/1971 [id number]    History:    Presents for annual exam.  Monthly cycle/last sexually active in November, new partner.  Normal Pap and mammogram history.  History of an ectopic and ruptured appendix.  5 cm fundal fibroid.  Diabetes diet controlled primary care manages.  Had stopped exercising is now starting back.  Past medical history, past surgical history, family history and social history were all reviewed and documented in the EPIC chart.  Works at [employer].  Mother hypertension.  ROS:  A ROS was performed and pertinent positives and negatives are included.  Exam:  Vitals:   01/24/18 0800  BP: 128/80  Weight: 223 lb (101.2 kg)  Height: 5\' 5"  (1.651 m)   Body mass index is 37.11 kg/m.   General appearance:  Normal Thyroid:  Symmetrical, normal in size, without palpable masses or nodularity. Respiratory  Auscultation:  Clear without wheezing or rhonchi Cardiovascular  Auscultation:  Regular rate, without rubs, murmurs or gallops  Edema/varicosities:  Not grossly evident Abdominal  Soft,nontender, without masses, guarding or rebound.  Liver/spleen:  No organomegaly noted  Hernia:  None appreciated  Skin  Inspection:  Grossly normal   Breasts: Examined lying and sitting.     Right: Without masses, retractions, discharge or axillary adenopathy.     Left: Without masses, retractions, discharge or axillary adenopathy. Gentitourinary   Inguinal/mons:  Normal without inguinal adenopathy  External genitalia:  Normal  BUS/Urethra/Skene's glands:  Normal  Vagina:  Normal  Cervix:  Normal  Uterus: fibroid uterus, shape and contour.  Midline and mobile  Adnexa/parametria:     Rt: Without masses or tenderness.   Lt: Without masses or tenderness.  Anus and perineum: Normal  Digital rectal exam: Normal sphincter tone without palpated masses or tenderness  Assessment/Plan:  47 y.o. S WF G1, P0 for annual exam with no complaints.    Monthly cycle/not currently  sexually active STD screen Fibroid uterus Diabetes primary care manages labs and meds Obesity  Plan: Reviewed importance of increasing exercise and decreasing calories/carbs, has gained 8 pounds in the past year.  Will check hemoglobin A1c and follow-up with primary care.  SBE's, continue annual screening mammogram, calcium rich foods, vitamin D 1000 daily encouraged.  Menopause reviewed currently having minimal symptoms.  Condoms encouraged until permanent partner.  Pap with HR HPV typing, new screening guidelines reviewed.  GC/chlamydia, HIV, hep B, C, RPR.  [place], 8:11 AM 01/24/2018

## 2018-01-25 LAB — HEMOGLOBIN A1C
EAG (MMOL/L): 7.1 (calc)
HEMOGLOBIN A1C: 6.1 %{Hb} — AB (ref <5.7)
Mean Plasma Glucose: 128 (calc)

## 2018-01-25 LAB — HEPATITIS B SURFACE ANTIGEN: Hepatitis B Surface Ag: NONREACTIVE

## 2018-01-25 LAB — RPR: RPR Ser Ql: NONREACTIVE

## 2018-01-25 LAB — C. TRACHOMATIS/N. GONORRHOEAE RNA
C. trachomatis RNA, TMA: NOT DETECTED
N. GONORRHOEAE RNA, TMA: NOT DETECTED

## 2018-01-25 LAB — HEPATITIS C ANTIBODY
Hepatitis C Ab: NONREACTIVE
SIGNAL TO CUT-OFF: 0.09 (ref <1.00)

## 2018-01-25 LAB — HIV ANTIBODY (ROUTINE TESTING W REFLEX): HIV: NONREACTIVE

## 2018-01-26 LAB — PAP, TP IMAGING W/ HPV RNA, RFLX HPV TYPE 16,18/45: HPV DNA High Risk: NOT DETECTED

## 2018-03-29 ENCOUNTER — Other Ambulatory Visit: Payer: Self-pay | Admitting: Women's Health

## 2018-03-29 DIAGNOSIS — Z1231 Encounter for screening mammogram for malignant neoplasm of breast: Secondary | ICD-10-CM

## 2018-05-10 ENCOUNTER — Ambulatory Visit: Payer: BLUE CROSS/BLUE SHIELD

## 2018-06-13 ENCOUNTER — Ambulatory Visit
Admission: RE | Admit: 2018-06-13 | Discharge: 2018-06-13 | Disposition: A | Discharge status: Home / Self Care | Payer: BLUE CROSS/BLUE SHIELD | Source: Ambulatory Visit | Attending: Women's Health | Admitting: Women's Health

## 2018-06-13 DIAGNOSIS — Z1231 Encounter for screening mammogram for malignant neoplasm of breast: Secondary | ICD-10-CM

## 2019-01-28 ENCOUNTER — Other Ambulatory Visit: Payer: Self-pay

## 2019-01-29 ENCOUNTER — Encounter: Payer: Self-pay | Admitting: Women's Health

## 2019-01-29 ENCOUNTER — Ambulatory Visit (INDEPENDENT_AMBULATORY_CARE_PROVIDER_SITE_OTHER): Payer: BC Managed Care – PPO | Admitting: Women's Health

## 2019-01-29 VITALS — BP 130/80 | Ht 65.0 in | Wt 223.0 lb

## 2019-01-29 DIAGNOSIS — Z01419 Encounter for gynecological examination (general) (routine) without abnormal findings: Secondary | ICD-10-CM

## 2019-01-29 DIAGNOSIS — Z13220 Encounter for screening for lipoid disorders: Secondary | ICD-10-CM

## 2019-01-29 DIAGNOSIS — R7309 Other abnormal glucose: Secondary | ICD-10-CM | POA: Diagnosis not present

## 2019-01-29 DIAGNOSIS — N912 Amenorrhea, unspecified: Secondary | ICD-10-CM

## 2019-01-29 NOTE — Progress Notes (Signed)
Freedom Montero 07-04-1970 [id number]    History:    Presents for annual exam.  Regular monthly cycles until June amenorrheic since with increased hot flushes.  Same partner negative STD screen.  Normal Pap and mammogram history.  06/2017 hemoglobin A1c 6, did not have follow-up with diabetic education/dietary as recommended.Marland Kitchen History of a 5 cm asymptomatic fibroid.  Past medical history, past surgical history, family history and social history were all reviewed and documented in the EPIC chart.  Works for [employer].  Mother hypertension.  ROS:  A ROS was performed and pertinent positives and negatives are included.  Exam:  Vitals:   01/29/19 0822  BP: 130/80  Weight: 223 lb (101.2 kg)  Height: 5\' 5"  (1.651 m)   Body mass index is 37.11 kg/m.   General appearance:  Normal Thyroid:  Symmetrical, normal in size, without palpable masses or nodularity. Respiratory  Auscultation:  Clear without wheezing or rhonchi Cardiovascular  Auscultation:  Regular rate, without rubs, murmurs or gallops  Edema/varicosities:  Not grossly evident Abdominal  Soft,nontender, without masses, guarding or rebound.  Liver/spleen:  No organomegaly noted  Hernia:  None appreciated  Skin  Inspection:  Grossly normal   Breasts: Examined lying and sitting.     Right: Without masses, retractions, discharge or axillary adenopathy.     Left: Without masses, retractions, discharge or axillary adenopathy. Gentitourinary   Inguinal/mons:  Normal without inguinal adenopathy  External genitalia:  Normal  BUS/Urethra/Skene's glands:  Normal  Vagina:  Normal  Cervix:  Normal  Uterus: Bulky history of fibroids, normal in size, shape and contour.  Midline and mobile  Adnexa/parametria:     Rt: Without masses or tenderness.   Lt: Without masses or tenderness.  Anus and perineum: Normal  Digital rectal exam: Normal sphincter tone without palpated masses or tenderness  Assessment/Plan:  48 y.o. SBF G0 for annual exam  with no complaints.  Perimenopausal/condoms Asymptomatic 5 cm fibroid uterus Obesity Prediabetes/elevated hemoglobin A1c  Plan: Discussed importance of increasing regular cardio type exercise, decreasing calorie/carbs, increasing regular cardio type exercise encouraged 30 minutes most days of the week.  SBEs, continue annual screening mammogram, calcium rich foods, vitamin D 1000 daily encouraged.  Perimenopause reviewed, CBC, CMP, lipid panel, hemoglobin A1c, FSH.  Pap normal 2019, new screening guidelines reviewed.    [place], 8:41 AM 01/29/2019

## 2019-01-29 NOTE — Patient Instructions (Signed)
Carbohydrate Counting for Diabetes Mellitus, Adult  Carbohydrate counting is a method of keeping track of how many carbohydrates you eat. Eating carbohydrates naturally increases the amount of sugar (glucose) in the blood. Counting how many carbohydrates you eat helps keep your blood glucose within normal limits, which helps you manage your diabetes (diabetes mellitus). It is important to know how many carbohydrates you can safely have in each meal. This is different for every person. A diet and nutrition specialist (registered dietitian) can help you make a meal plan and calculate how many carbohydrates you should have at each meal and snack. Carbohydrates are found in the following foods:  Grains, such as breads and cereals.  Dried beans and soy products.  Starchy vegetables, such as potatoes, peas, and corn.  Fruit and fruit juices.  Milk and yogurt.  Sweets and snack foods, such as cake, cookies, candy, chips, and soft drinks. How do I count carbohydrates? There are two ways to count carbohydrates in food. You can use either of the methods or a combination of both. Reading "Nutrition Facts" on packaged food The "Nutrition Facts" list is included on the labels of almost all packaged foods and beverages in the [place] It includes:  The serving size.  Information about nutrients in each serving, including the grams (g) of carbohydrate per serving. To use the "Nutrition Facts":  Decide how many servings you will have.  Multiply the number of servings by the number of carbohydrates per serving.  The resulting number is the total amount of carbohydrates that you will be having. Learning standard serving sizes of other foods When you eat carbohydrate foods that are not packaged or do not include "Nutrition Facts" on the label, you need to measure the servings in order to count the amount of carbohydrates:  Measure the foods that you will eat with a food scale or measuring cup, if needed.   Decide how many standard-size servings you will eat.  Multiply the number of servings by 15. Most carbohydrate-rich foods have about 15 g of carbohydrates per serving. ? For example, if you eat 8 oz (170 g) of strawberries, you will have eaten 2 servings and 30 g of carbohydrates (2 servings x 15 g = 30 g).  For foods that have more than one food mixed, such as soups and casseroles, you must count the carbohydrates in each food that is included. The following list contains standard serving sizes of common carbohydrate-rich foods. Each of these servings has about 15 g of carbohydrates:   hamburger bun or  English muffin.   oz (15 mL) syrup.   oz (14 g) jelly.  1 slice of bread.  1 six-inch tortilla.  3 oz (85 g) cooked rice or pasta.  4 oz (113 g) cooked dried beans.  4 oz (113 g) starchy vegetable, such as peas, corn, or potatoes.  4 oz (113 g) hot cereal.  4 oz (113 g) mashed potatoes or  of a large baked potato.  4 oz (113 g) canned or frozen fruit.  4 oz (120 mL) fruit juice.  4-6 crackers.  6 chicken nuggets.  6 oz (170 g) unsweetened dry cereal.  6 oz (170 g) plain fat-free yogurt or yogurt sweetened with artificial sweeteners.  8 oz (240 mL) milk.  8 oz (170 g) fresh fruit or one small piece of fruit.  24 oz (680 g) popped popcorn. Example of carbohydrate counting Sample meal  3 oz (85 g) chicken breast.  6 oz (170 g)   brown rice.  4 oz (113 g) corn.  8 oz (240 mL) milk.  8 oz (170 g) strawberries with sugar-free whipped topping. Carbohydrate calculation 1. Identify the foods that contain carbohydrates: ? Rice. ? Corn. ? Milk. ? Strawberries. 2. Calculate how many servings you have of each food: ? 2 servings rice. ? 1 serving corn. ? 1 serving milk. ? 1 serving strawberries. 3. Multiply each number of servings by 15 g: ? 2 servings rice x 15 g = 30 g. ? 1 serving corn x 15 g = 15 g. ? 1 serving milk x 15 g = 15 g. ? 1 serving  strawberries x 15 g = 15 g. 4. Add together all of the amounts to find the total grams of carbohydrates eaten: ? 30 g + 15 g + 15 g + 15 g = 75 g of carbohydrates total. Summary  Carbohydrate counting is a method of keeping track of how many carbohydrates you eat.  Eating carbohydrates naturally increases the amount of sugar (glucose) in the blood.  Counting how many carbohydrates you eat helps keep your blood glucose within normal limits, which helps you manage your diabetes.  A diet and nutrition specialist (registered dietitian) can help you make a meal plan and calculate how many carbohydrates you should have at each meal and snack. This information is not intended to replace advice given to you by your health care provider. Make sure you discuss any questions you have with your health care provider. Document Released: 05/23/2005 Document Revised: 12/15/2016 Document Reviewed: 11/04/2015 Elsevier Patient Education  2020 Else[employer] Health Maintenance, Female Adopting a healthy lifestyle and getting preventive care are important in promoting health and wellness. Ask your health care provider about:  The right schedule for you to have regular tests and exams.  Things you can do on your own to prevent diseases and keep yourself healthy. What should I know about diet, weight, and exercise? Eat a healthy diet   Eat a diet that includes plenty of vegetables, fruits, low-fat dairy products, and lean protein.  Do not eat a lot of foods that are high in solid fats, added sugars, or sodium. Maintain a healthy weight Body mass index (BMI) is used to identify weight problems. It estimates body fat based on height and weight. Your health care provider can help determine your BMI and help you achieve or maintain a healthy weight. Get regular exercise Get regular exercise. This is one of the most important things you can do for your health. Most adults should:  Exercise for at least 150  minutes each week. The exercise should increase your heart rate and make you sweat (moderate-intensity exercise).  Do strengthening exercises at least twice a week. This is in addition to the moderate-intensity exercise.  Spend less time sitting. Even light physical activity can be beneficial. Watch cholesterol and blood lipids Have your blood tested for lipids and cholesterol at 47 years of age, then have this test every 5 years. Have your cholesterol levels checked more often if:  Your lipid or cholesterol levels are high.  You are older than 47 years of age.  You are at high risk for heart disease. What should I know about cancer screening? Depending on your health history and family history, you may need to have cancer screening at various ages. This may include screening for:  Breast cancer.  Cervical cancer.  Colorectal cancer.  Skin cancer.  Lung cancer. What should I know about heart disease, diabetes,   and high blood pressure? Blood pressure and heart disease  High blood pressure causes heart disease and increases the risk of stroke. This is more likely to develop in people who have high blood pressure readings, are of African descent, or are overweight.  Have your blood pressure checked: ? Every 3-5 years if you are 18-39 years of age. ? Every year if you are 40 years old or older. Diabetes Have regular diabetes screenings. This checks your fasting blood sugar level. Have the screening done:  Once every three years after age 40 if you are at a normal weight and have a low risk for diabetes.  More often and at a younger age if you are overweight or have a high risk for diabetes. What should I know about preventing infection? Hepatitis B If you have a higher risk for hepatitis B, you should be screened for this virus. Talk with your health care provider to find out if you are at risk for hepatitis B infection. Hepatitis C Testing is recommended for:  Everyone born  from 1945 through 1965.  Anyone with known risk factors for hepatitis C. Sexually transmitted infections (STIs)  Get screened for STIs, including gonorrhea and chlamydia, if: ? You are sexually active and are younger than 47 years of age. ? You are older than 47 years of age and your health care provider tells you that you are at risk for this type of infection. ? Your sexual activity has changed since you were last screened, and you are at increased risk for chlamydia or gonorrhea. Ask your health care provider if you are at risk.  Ask your health care provider about whether you are at high risk for HIV. Your health care provider may recommend a prescription medicine to help prevent HIV infection. If you choose to take medicine to prevent HIV, you should first get tested for HIV. You should then be tested every 3 months for as long as you are taking the medicine. Pregnancy  If you are about to stop having your period (premenopausal) and you may become pregnant, seek counseling before you get pregnant.  Take 400 to 800 micrograms (mcg) of folic acid every day if you become pregnant.  Ask for birth control (contraception) if you want to prevent pregnancy. Osteoporosis and menopause Osteoporosis is a disease in which the bones lose minerals and strength with aging. This can result in bone fractures. If you are 65 years old or older, or if you are at risk for osteoporosis and fractures, ask your health care provider if you should:  Be screened for bone loss.  Take a calcium or vitamin D supplement to lower your risk of fractures.  Be given hormone replacement therapy (HRT) to treat symptoms of menopause. Follow these instructions at home: Lifestyle  Do not use any products that contain nicotine or tobacco, such as cigarettes, e-cigarettes, and chewing tobacco. If you need help quitting, ask your health care provider.  Do not use street drugs.  Do not share needles.  Ask your health  care provider for help if you need support or information about quitting drugs. Alcohol use  Do not drink alcohol if: ? Your health care provider tells you not to drink. ? You are pregnant, may be pregnant, or are planning to become pregnant.  If you drink alcohol: ? Limit how much you use to 0-1 drink a day. ? Limit intake if you are breastfeeding.  Be aware of how much alcohol is in your drink.   In the [place] one drink equals one 12 oz bottle of beer (355 mL), one 5 oz glass of wine (148 mL), or one 1 oz glass of hard liquor (44 mL). General instructions  Schedule regular health, dental, and eye exams.  Stay current with your vaccines.  Tell your health care provider if: ? You often feel depressed. ? You have ever been abused or do not feel safe at home. Summary  Adopting a healthy lifestyle and getting preventive care are important in promoting health and wellness.  Follow your health care provider's instructions about healthy diet, exercising, and getting tested or screened for diseases.  Follow your health care provider's instructions on monitoring your cholesterol and blood pressure. This information is not intended to replace advice given to you by your health care provider. Make sure you discuss any questions you have with your health care provider. Document Released: 12/06/2010 Document Revised: 05/16/2018 Document Reviewed: 05/16/2018 Elsevier Patient Education  2020 Elsevier Inc.  

## 2019-01-30 ENCOUNTER — Other Ambulatory Visit: Payer: Self-pay

## 2019-01-30 LAB — COMPREHENSIVE METABOLIC PANEL
AG Ratio: 1.6 (calc) (ref 1.0–2.5)
ALT: 29 U/L (ref 6–29)
AST: 23 U/L (ref 10–35)
Albumin: 4.4 g/dL (ref 3.6–5.1)
Alkaline phosphatase (APISO): 55 U/L (ref 31–125)
BUN: 9 mg/dL (ref 7–25)
CO2: 24 mmol/L (ref 20–32)
Calcium: 9 mg/dL (ref 8.6–10.2)
Chloride: 105 mmol/L (ref 98–110)
Creat: 0.79 mg/dL (ref 0.50–1.10)
Globulin: 2.8 g/dL (calc) (ref 1.9–3.7)
Glucose, Bld: 124 mg/dL — ABNORMAL HIGH (ref 65–99)
Potassium: 3.9 mmol/L (ref 3.5–5.3)
Sodium: 138 mmol/L (ref 135–146)
Total Bilirubin: 0.7 mg/dL (ref 0.2–1.2)
Total Protein: 7.2 g/dL (ref 6.1–8.1)

## 2019-01-30 LAB — CBC WITH DIFFERENTIAL/PLATELET
Absolute Monocytes: 322 cells/uL (ref 200–950)
Basophils Absolute: 31 cells/uL (ref 0–200)
Basophils Relative: 0.5 %
Eosinophils Absolute: 68 cells/uL (ref 15–500)
Eosinophils Relative: 1.1 %
HCT: 43.4 % (ref 35.0–45.0)
Hemoglobin: 13.8 g/dL (ref 11.7–15.5)
Lymphs Abs: 1624 cells/uL (ref 850–3900)
MCH: 29.6 pg (ref 27.0–33.0)
MCHC: 31.8 g/dL — ABNORMAL LOW (ref 32.0–36.0)
MCV: 92.9 fL (ref 80.0–100.0)
MPV: 12.5 fL (ref 7.5–12.5)
Monocytes Relative: 5.2 %
Neutro Abs: 4154 cells/uL (ref 1500–7800)
Neutrophils Relative %: 67 %
Platelets: 167 10*3/uL (ref 140–400)
RBC: 4.67 10*6/uL (ref 3.80–5.10)
RDW: 12.9 % (ref 11.0–15.0)
Total Lymphocyte: 26.2 %
WBC: 6.2 10*3/uL (ref 3.8–10.8)

## 2019-01-30 LAB — HEMOGLOBIN A1C
Hgb A1c MFr Bld: 6.4 % of total Hgb — ABNORMAL HIGH (ref <5.7)
Mean Plasma Glucose: 137 (calc)
eAG (mmol/L): 7.6 (calc)

## 2019-01-30 LAB — FOLLICLE STIMULATING HORMONE: FSH: 5.6 m[IU]/mL

## 2019-01-30 LAB — LIPID PANEL
Cholesterol: 155 mg/dL (ref <200)
HDL: 53 mg/dL (ref >=50)
LDL Cholesterol (Calc): 88 mg/dL (calc)
Non-HDL Cholesterol (Calc): 102 mg/dL (calc) (ref <130)
Total CHOL/HDL Ratio: 2.9 (calc) (ref <5.0)
Triglycerides: 53 mg/dL (ref <150)

## 2019-01-30 MED ORDER — MEDROXYPROGESTERONE ACETATE 10 MG PO TABS: ORAL_TABLET | ORAL | 6 refills | Status: DC

## 2019-02-05 ENCOUNTER — Telehealth: Payer: Self-pay | Admitting: *Deleted

## 2019-02-05 NOTE — Telephone Encounter (Signed)
Patient called and left message asking how long should she wait to call if no cycle after taking provera 10 mg tablet #5. I called and received her voicemail and left detailed message on cell wait until 2 weeks after taking last pill to call if no cycle.

## 2019-05-23 ENCOUNTER — Other Ambulatory Visit: Payer: Self-pay | Admitting: Women's Health

## 2019-05-23 DIAGNOSIS — Z1231 Encounter for screening mammogram for malignant neoplasm of breast: Secondary | ICD-10-CM

## 2019-07-10 ENCOUNTER — Other Ambulatory Visit: Payer: Self-pay

## 2019-07-10 ENCOUNTER — Ambulatory Visit
Admission: RE | Admit: 2019-07-10 | Discharge: 2019-07-10 | Disposition: A | Discharge status: Home / Self Care | Payer: BLUE CROSS/BLUE SHIELD | Source: Ambulatory Visit | Attending: Women's Health | Admitting: Women's Health

## 2019-07-10 DIAGNOSIS — Z1231 Encounter for screening mammogram for malignant neoplasm of breast: Secondary | ICD-10-CM

## 2019-12-26 ENCOUNTER — Encounter: Payer: Self-pay | Admitting: Emergency Medicine

## 2019-12-26 ENCOUNTER — Other Ambulatory Visit: Payer: Self-pay

## 2019-12-26 ENCOUNTER — Ambulatory Visit
Admission: EM | Admit: 2019-12-26 | Discharge: 2019-12-26 | Disposition: A | Discharge status: Home / Self Care | Payer: BC Managed Care – PPO | Attending: Family Medicine | Admitting: Family Medicine

## 2019-12-26 DIAGNOSIS — Z20822 Contact with and (suspected) exposure to covid-19: Secondary | ICD-10-CM | POA: Diagnosis not present

## 2019-12-26 DIAGNOSIS — J320 Chronic maxillary sinusitis: Secondary | ICD-10-CM | POA: Diagnosis not present

## 2019-12-26 MED ORDER — AMOXICILLIN-POT CLAVULANATE 875-125 MG PO TABS: 1.0000 | ORAL_TABLET | Freq: Two times a day (BID) | ORAL | 0 refills | Status: DC

## 2019-12-26 NOTE — ED Triage Notes (Signed)
Patient presents to [facility] for assessment of 1 week of facial pain, post-nasal drip, cough, body aches, fevers.

## 2019-12-26 NOTE — ED Provider Notes (Signed)
[facility]    CSN: [id number] Arrival date & time: 12/26/19  1906      History   Chief Complaint Chief Complaint  Patient presents with  . URI    HPI [name] is a 49 y.o. female.   1 week history of sinus pain congestion.  Patient has been using Nettie pot and getting some yellow mucus from her sinuses.  She denies any cough or lower respiratory symptoms  HPI  Past Medical History:  Diagnosis Date  . Diabetes mellitus 2008   dr balan  . Ectopic pregnancy 2004  . Type B blood, Rh negative     Patient Active Problem List   Diagnosis Date Noted  . Fibroids 12/29/2011  . Diabetes mellitus (Lupus) 12/29/2011    Past Surgical History:  Procedure Laterality Date  . APPENDECTOMY  1987  . BREAST BIOPSY  1993  . BREAST EXCISIONAL BIOPSY Right   . LAPAROSCOPIC OVARIAN CYSTECTOMY  1993    OB History    Gravida  1   Para  0   Term      Preterm      AB  1   Living  0     SAB      TAB      Ectopic  1   Multiple      Live Births               Home Medications    Prior to Admission medications   Medication Sig Start Date End Date Taking? Authorizing Provider  Ascorbic Acid (VITAMIN C PO) Take 1 tablet by mouth daily.    [provider]  BIOTIN PO Take 1 tablet by mouth daily.    [provider]  Cholecalciferol (VITAMIN D PO) Take 1 tablet by mouth daily.    [provider]  medroxyPROGESTERone (PROVERA) 10 MG tablet Take one po qd x 5 days. If no menses after taking call the office. 01/30/19   Huel Cote, NP  Multiple Vitamin (MULTIVITAMIN) capsule Take 1 capsule by mouth daily.    [provider]  TURMERIC PO Take 1 tablet by mouth daily.    [provider]  VITAMIN E PO Take 1 tablet by mouth daily.    [provider]    Family History Family History  Problem Relation Age of Onset  . Hypertension Mother   . Other Father        ? liver  . Diabetes Maternal  Grandfather   . Breast cancer Cousin        paternal    Social History Social History   Tobacco Use  . Smoking status: Former Smoker    Quit date: 12/28/2004    Years since quitting: 15.0  . Smokeless tobacco: Never Used  Vaping Use  . Vaping Use: Never used  Substance Use Topics  . Alcohol use: Yes    Alcohol/week: 0.0 standard drinks    Comment: SOCIAL  . Drug use: No     Allergies   Patient has no known allergies.   Review of Systems Review of Systems  HENT: Positive for sinus pressure and sinus pain.   Respiratory: Negative for cough.   All other systems reviewed and are negative.    Physical Exam Triage Vital Signs ED Triage Vitals [12/26/19 1920]  Enc Vitals Group     BP      Pulse      Resp      Temp  Temp src      SpO2      Weight      Height      Head Circumference      Peak Flow      Pain Score 6     Pain Loc      Pain Edu?      Excl. in [employer]?    No data found.  Updated Vital Signs There were no vitals taken for this visit.  Visual Acuity Right Eye Distance:   Left Eye Distance:   Bilateral Distance:    Right Eye Near:   Left Eye Near:    Bilateral Near:     Physical Exam Vitals and nursing note reviewed.  Constitutional:      Appearance: Normal appearance.  HENT:     Right Ear: Tympanic membrane normal.     Left Ear: Tympanic membrane normal.     Nose: Nose normal.     Mouth/Throat:     Mouth: Mucous membranes are moist.  Pulmonary:     Effort: Pulmonary effort is normal.     Breath sounds: Normal breath sounds.  Neurological:     General: No focal deficit present.     Mental Status: She is alert and oriented to person, place, and time.      UC Treatments / Results  Labs (all labs ordered are listed, but only abnormal results are displayed) Labs Reviewed  NOVEL CORONAVIRUS, NAA    EKG   Radiology No results found.  Procedures Procedures (including critical care time)  Medications Ordered in  UC Medications - No data to display  Initial Impression / Assessment and Plan / UC Course  I have reviewed the triage vital signs and the nursing notes.  Pertinent labs & imaging results that were available during my care of the patient were reviewed by me and considered in my medical decision making (see chart for details).     Sinusitis.  Will treat with antibiotic continue use a Nettie pot and topical steroid for the nose Final Clinical Impressions(s) / UC Diagnoses   Final diagnoses:  Suspected COVID-19 virus infection   Discharge Instructions   None    ED Prescriptions    None     PDMP not reviewed this encounter.   Wardell Honour, MD 12/26/19 1949

## 2019-12-26 NOTE — Discharge Instructions (Signed)
Continue use of Nettie pot May also try Flonase or Nasonex, over-the-counter

## 2019-12-28 LAB — SARS-COV-2, NAA 2 DAY TAT

## 2019-12-28 LAB — NOVEL CORONAVIRUS, NAA: SARS-CoV-2, NAA: DETECTED — AB

## 2019-12-29 ENCOUNTER — Telehealth: Payer: Self-pay | Admitting: Adult Health

## 2019-12-29 NOTE — Telephone Encounter (Signed)
Called and LMOM about COVID 19 treatment with monoclonal antibody infusion for patients who are at increased risk for complications and or hospitalization for the virus.  [name] is at increased risk for these based on her BMI and diabetes.    I asked that she call me back at [phone].    My chart message sent.  Wilber Bihari, NP

## 2019-12-31 ENCOUNTER — Telehealth (HOSPITAL_COMMUNITY): Payer: Self-pay | Admitting: Emergency Medicine

## 2019-12-31 NOTE — Telephone Encounter (Signed)
Patient returned call.  Verified identity using two identifiers.  Provided positive COVID result.  Discussed isolation, infection prevention and ER precautions.  Also informed patient that the infusion clinic had tried to contact her and to check her voicemail and follow up with them.  Patient verbalized understanding of the above.  No questions at this time.

## 2020-02-04 ENCOUNTER — Other Ambulatory Visit: Payer: Self-pay

## 2020-02-04 ENCOUNTER — Ambulatory Visit (INDEPENDENT_AMBULATORY_CARE_PROVIDER_SITE_OTHER): Payer: BC Managed Care – PPO | Admitting: Nurse Practitioner

## 2020-02-04 ENCOUNTER — Encounter: Payer: Self-pay | Admitting: Nurse Practitioner

## 2020-02-04 VITALS — BP 124/80 | Ht 65.0 in | Wt 210.0 lb

## 2020-02-04 DIAGNOSIS — N951 Menopausal and female climacteric states: Secondary | ICD-10-CM

## 2020-02-04 DIAGNOSIS — Z113 Encounter for screening for infections with a predominantly sexual mode of transmission: Secondary | ICD-10-CM

## 2020-02-04 DIAGNOSIS — Z01419 Encounter for gynecological examination (general) (routine) without abnormal findings: Secondary | ICD-10-CM

## 2020-02-04 NOTE — Patient Instructions (Signed)
Black Cohosh, Ginseng, [employer], Female Adopting a healthy lifestyle and getting preventive care are important in promoting health and wellness. Ask your health care provider about:  The right schedule for you to have regular tests and exams.  Things you can do on your own to prevent diseases and keep yourself healthy. What should I know about diet, weight, and exercise? Eat a healthy diet   Eat a diet that includes plenty of vegetables, fruits, low-fat dairy products, and lean protein.  Do not eat a lot of foods that are high in solid fats, added sugars, or sodium. Maintain a healthy weight Body mass index (BMI) is used to identify weight problems. It estimates body fat based on height and weight. Your health care provider can help determine your BMI and help you achieve or maintain a healthy weight. Get regular exercise Get regular exercise. This is one of the most important things you can do for your health. Most adults should:  Exercise for at least 150 minutes each week. The exercise should increase your heart rate and make you sweat (moderate-intensity exercise).  Do strengthening exercises at least twice a week. This is in addition to the moderate-intensity exercise.  Spend less time sitting. Even light physical activity can be beneficial. Watch cholesterol and blood lipids Have your blood tested for lipids and cholesterol at 48 years of age, then have this test every 5 years. Have your cholesterol levels checked more often if:  Your lipid or cholesterol levels are high.  You are older than 48 years of age.  You are at high risk for heart disease. What should I know about cancer screening? Depending on your health history and family history, you may need to have cancer screening at various ages. This may include screening for:  Breast cancer.  Cervical cancer.  Colorectal cancer.  Skin cancer.  Lung cancer. What should I know about heart  disease, diabetes, and high blood pressure? Blood pressure and heart disease  High blood pressure causes heart disease and increases the risk of stroke. This is more likely to develop in people who have high blood pressure readings, are of African descent, or are overweight.  Have your blood pressure checked: ? Every 3-5 years if you are 48-48 years of age. ? Every year if you are 48 years old or older. Diabetes Have regular diabetes screenings. This checks your fasting blood sugar level. Have the screening done:  Once every three years after age 48 if you are at a normal weight and have a low risk for diabetes.  More often and at a younger age if you are overweight or have a high risk for diabetes. What should I know about preventing infection? Hepatitis B If you have a higher risk for hepatitis B, you should be screened for this virus. Talk with your health care provider to find out if you are at risk for hepatitis B infection. Hepatitis C Testing is recommended for:  Everyone born from [birth year] through 1976.  Anyone with known risk factors for hepatitis C. Sexually transmitted infections (STIs)  Get screened for STIs, including gonorrhea and chlamydia, if: ? You are sexually active and are younger than 48 years of age. ? You are older than 48 years of age and your health care provider tells you that you are at risk for this type of infection. ? Your sexual activity has changed since you were last screened, and you are at increased risk for chlamydia or gonorrhea. Ask  your health care provider if you are at risk.  Ask your health care provider about whether you are at high risk for HIV. Your health care provider may recommend a prescription medicine to help prevent HIV infection. If you choose to take medicine to prevent HIV, you should first get tested for HIV. You should then be tested every 3 months for as long as you are taking the medicine. Pregnancy  If you are about to stop  having your period (premenopausal) and you may become pregnant, seek counseling before you get pregnant.  Take 400 to 800 micrograms (mcg) of folic acid every day if you become pregnant.  Ask for birth control (contraception) if you want to prevent pregnancy. Osteoporosis and menopause Osteoporosis is a disease in which the bones lose minerals and strength with aging. This can result in bone fractures. If you are 61 years old or older, or if you are at risk for osteoporosis and fractures, ask your health care provider if you should:  Be screened for bone loss.  Take a calcium or vitamin D supplement to lower your risk of fractures.  Be given hormone replacement therapy (HRT) to treat symptoms of menopause. Follow these instructions at home: Lifestyle  Do not use any products that contain nicotine or tobacco, such as cigarettes, e-cigarettes, and chewing tobacco. If you need help quitting, ask your health care provider.  Do not use street drugs.  Do not share needles.  Ask your health care provider for help if you need support or information about quitting drugs. Alcohol use  Do not drink alcohol if: ? Your health care provider tells you not to drink. ? You are pregnant, may be pregnant, or are planning to become pregnant.  If you drink alcohol: ? Limit how much you use to 0-1 drink a day. ? Limit intake if you are breastfeeding.  Be aware of how much alcohol is in your drink. In the [place] one drink equals one 12 oz bottle of beer (355 mL), one 5 oz glass of wine (148 mL), or one 1 oz glass of hard liquor (44 mL). General instructions  Schedule regular health, dental, and eye exams.  Stay current with your vaccines.  Tell your health care provider if: ? You often feel depressed. ? You have ever been abused or do not feel safe at home. Summary  Adopting a healthy lifestyle and getting preventive care are important in promoting health and wellness.  Follow your health  care provider's instructions about healthy diet, exercising, and getting tested or screened for diseases.  Follow your health care provider's instructions on monitoring your cholesterol and blood pressure. This information is not intended to replace advice given to you by your health care provider. Make sure you discuss any questions you have with your health care provider. Document Revised: 05/16/2018 Document Reviewed: 05/16/2018 Elsevier Patient Education  2020 [employer].

## 2020-02-04 NOTE — Progress Notes (Signed)
   [name] [birth year] [id number]   History:  49 y.o. G1P0010 presents for annual exam without GYN complaints. Having irregular cycles with mild hot flashes, night sweats and mood changes. Normal pap and mammogram history. Not currently sexually active but would like STD screening today. History of intramural fibroids.   Gynecologic History Patient's last menstrual period was 01/05/2020. Period Pattern: (!) Irregular Menstrual Flow: Moderate Menstrual Control: Tampon, Maxi pad Dysmenorrhea: (!) Mild Dysmenorrhea Symptoms: Cramping Contraception: abstinence Last Pap: 01/24/2018. Results were: normal Last mammogram: 07/10/2019. Results were: normal   Past medical history, past surgical history, family history and social history were all reviewed and documented in the EPIC chart.  ROS:  A ROS was performed and pertinent positives and negatives are included.  Exam:  Vitals:   02/04/20 0812  BP: 124/80  Weight: 210 lb (95.3 kg)  Height: 5\' 5"  (1.651 m)   Body mass index is 34.95 kg/m.  General appearance:  Normal Thyroid:  Symmetrical, normal in size, without palpable masses or nodularity. Respiratory  Auscultation:  Clear without wheezing or rhonchi Cardiovascular  Auscultation:  Regular rate, without rubs, murmurs or gallops  Edema/varicosities:  Not grossly evident Abdominal  Soft,nontender, without masses, guarding or rebound.  Liver/spleen:  No organomegaly noted  Hernia:  None appreciated  Skin  Inspection:  Grossly normal   Breasts: Examined lying and sitting.   Right: Without masses, retractions, discharge or axillary adenopathy.   Left: Without masses, retractions, discharge or axillary adenopathy. Gentitourinary   Inguinal/mons:  Normal without inguinal adenopathy  External genitalia:  Normal  BUS/Urethra/Skene's glands:  Normal  Vagina:  Normal  Cervix:  Normal  Uterus:  Difficult to palpate due to body habitus but no gross masses or  tenderness  Adnexa/parametria:     Rt: Without masses or tenderness.   Lt: Without masses or tenderness.  Anus and perineum: Normal  Digital rectal exam: Normal sphincter tone without palpated masses or tenderness  Assessment/Plan:  49 y.o. G1P0010 for annual exam.   Well female exam with routine gynecological exam - Education provided on SBEs, importance of preventative screenings, current guidelines, high calcium diet, regular exercise, and multivitamin daily. Labs done with primary care.   Screen for STD (sexually transmitted disease) - Plan: C. trachomatis/N. gonorrhoeae RNA, HIV Antibody (routine testing w rflx), RPR  Perimenopause - beginning to have some irregularity in her cycles and mild hot flashes, night sweats, and mood changes. Taking Vitamin E. Recommendations for OTC management for hot flashes provided.  Follow up in 1 year for annual.       Tamela Gammon [facility], 8:33 AM 02/04/2020

## 2020-02-05 LAB — C. TRACHOMATIS/N. GONORRHOEAE RNA
C. trachomatis RNA, TMA: NOT DETECTED
N. gonorrhoeae RNA, TMA: NOT DETECTED

## 2020-02-06 LAB — RPR: RPR Ser Ql: NONREACTIVE

## 2020-02-06 LAB — HIV ANTIBODY (ROUTINE TESTING W REFLEX): HIV 1&2 Ab, 4th Generation: NONREACTIVE

## 2020-07-03 ENCOUNTER — Other Ambulatory Visit: Payer: Self-pay | Admitting: Internal Medicine

## 2020-07-03 DIAGNOSIS — Z1231 Encounter for screening mammogram for malignant neoplasm of breast: Secondary | ICD-10-CM

## 2020-07-14 ENCOUNTER — Ambulatory Visit: Payer: BC Managed Care – PPO

## 2020-08-31 ENCOUNTER — Ambulatory Visit
Admission: RE | Admit: 2020-08-31 | Discharge: 2020-08-31 | Disposition: A | Discharge status: Home / Self Care | Payer: BC Managed Care – PPO | Source: Ambulatory Visit | Attending: Internal Medicine | Admitting: Internal Medicine

## 2020-08-31 ENCOUNTER — Other Ambulatory Visit: Payer: Self-pay

## 2020-08-31 DIAGNOSIS — Z1231 Encounter for screening mammogram for malignant neoplasm of breast: Secondary | ICD-10-CM

## 2020-10-22 ENCOUNTER — Other Ambulatory Visit: Payer: Self-pay

## 2020-10-22 ENCOUNTER — Ambulatory Visit
Admission: RE | Admit: 2020-10-22 | Discharge: 2020-10-22 | Disposition: A | Discharge status: Home / Self Care | Payer: BC Managed Care – PPO | Source: Ambulatory Visit | Attending: Internal Medicine | Admitting: Internal Medicine

## 2020-10-22 ENCOUNTER — Ambulatory Visit: Payer: BC Managed Care – PPO

## 2020-12-01 ENCOUNTER — Other Ambulatory Visit: Payer: Self-pay

## 2020-12-01 ENCOUNTER — Encounter: Payer: Self-pay | Admitting: Obstetrics & Gynecology

## 2020-12-01 ENCOUNTER — Ambulatory Visit (INDEPENDENT_AMBULATORY_CARE_PROVIDER_SITE_OTHER): Payer: BC Managed Care – PPO | Admitting: Obstetrics & Gynecology

## 2020-12-01 VITALS — BP 118/80 | HR 77 | Resp 16

## 2020-12-01 DIAGNOSIS — Z113 Encounter for screening for infections with a predominantly sexual mode of transmission: Secondary | ICD-10-CM | POA: Diagnosis not present

## 2020-12-01 DIAGNOSIS — Z789 Other specified health status: Secondary | ICD-10-CM | POA: Diagnosis not present

## 2020-12-01 LAB — PREGNANCY, URINE: Preg Test, Ur: NEGATIVE

## 2020-12-01 NOTE — Progress Notes (Signed)
    [name] 11-27-1970 [id number]        50 y.o.  G1P0010 Boyfriend  RP: Condom broke last week for STI screen  HPI: Uses condoms for STI prevention and contraception.  Had a condom break about 1 month ago.  Normal LMP 6/17th.  No vaginal discharge.  No pelvic pain.  No fever.   OB History  Gravida Para Term Preterm AB Living  1 0     1 0  SAB IAB Ectopic Multiple Live Births      1        # Outcome Date GA Lbr Len/2nd Weight Sex Delivery Anes PTL Lv  1 Ectopic             Past medical history,surgical history, problem list, medications, allergies, family history and social history were all reviewed and documented in the EPIC chart.   Directed ROS with pertinent positives and negatives documented in the history of present illness/assessment and plan.  Exam:  Vitals:   12/01/20 1149  BP: 118/80  Pulse: 77  Resp: 16   General appearance:  Normal  Abdomen: Normal  Gynecologic exam: Vulva normal.  Speculum:  Cervix/Vagina normal.  No blood.  Normal secretions.  Gono-chlam done.  UPT Neg   Assessment/Plan:  50 y.o. G1P0010   1. Problem with condom Condom broke about a month ago.  Normal gynecologic exam.  Urine pregnancy test negative.  Full STI screening done. - Pregnancy, urine  2. Screen for STD (sexually transmitted disease) Will continue to use condoms for both STI prevention and contraception.  Recommend spermicide as well. - HIV antibody (with reflex) - RPR - Hepatitis B Surface AntiGEN - Hepatitis C Antibody - C. trachomatis/N. gonorrhoeae RNA  Other orders - Aloe Vera Concentrate 25 MG CAPS; Take by mouth. - APPLE CIDER VINEGAR PO; Take by mouth.   Princess Bruins MD, 12:17 PM 12/01/2020

## 2020-12-02 LAB — RPR: RPR Ser Ql: NONREACTIVE

## 2020-12-02 LAB — HIV ANTIBODY (ROUTINE TESTING W REFLEX): HIV 1&2 Ab, 4th Generation: NONREACTIVE

## 2020-12-02 LAB — HEPATITIS C ANTIBODY
Hepatitis C Ab: NONREACTIVE
SIGNAL TO CUT-OFF: 0.09 (ref <1.00)

## 2020-12-02 LAB — C. TRACHOMATIS/N. GONORRHOEAE RNA
C. trachomatis RNA, TMA: NOT DETECTED
N. gonorrhoeae RNA, TMA: NOT DETECTED

## 2020-12-02 LAB — HEPATITIS B SURFACE ANTIGEN: Hepatitis B Surface Ag: NONREACTIVE

## 2021-02-04 ENCOUNTER — Ambulatory Visit: Payer: Self-pay | Admitting: Nurse Practitioner

## 2021-11-30 ENCOUNTER — Other Ambulatory Visit: Payer: Self-pay | Admitting: Internal Medicine

## 2021-11-30 DIAGNOSIS — Z1231 Encounter for screening mammogram for malignant neoplasm of breast: Secondary | ICD-10-CM

## 2021-12-02 ENCOUNTER — Ambulatory Visit
Admission: RE | Admit: 2021-12-02 | Discharge: 2021-12-02 | Disposition: A | Discharge status: Home / Self Care | Payer: BC Managed Care – PPO | Source: Ambulatory Visit | Attending: Internal Medicine | Admitting: Internal Medicine

## 2021-12-02 DIAGNOSIS — Z1231 Encounter for screening mammogram for malignant neoplasm of breast: Secondary | ICD-10-CM

## 2022-07-27 ENCOUNTER — Encounter: Payer: Self-pay | Admitting: Nurse Practitioner

## 2022-07-27 ENCOUNTER — Ambulatory Visit (INDEPENDENT_AMBULATORY_CARE_PROVIDER_SITE_OTHER): Payer: BC Managed Care – PPO | Admitting: Nurse Practitioner

## 2022-07-27 VITALS — BP 130/80 | HR 78 | Wt 222.0 lb

## 2022-07-27 DIAGNOSIS — N760 Acute vaginitis: Secondary | ICD-10-CM | POA: Diagnosis not present

## 2022-07-27 DIAGNOSIS — A599 Trichomoniasis, unspecified: Secondary | ICD-10-CM

## 2022-07-27 DIAGNOSIS — N898 Other specified noninflammatory disorders of vagina: Secondary | ICD-10-CM

## 2022-07-27 DIAGNOSIS — Z113 Encounter for screening for infections with a predominantly sexual mode of transmission: Secondary | ICD-10-CM | POA: Diagnosis not present

## 2022-07-27 DIAGNOSIS — B9689 Other specified bacterial agents as the cause of diseases classified elsewhere: Secondary | ICD-10-CM

## 2022-07-27 LAB — WET PREP FOR TRICH, YEAST, CLUE

## 2022-07-27 MED ORDER — METRONIDAZOLE 500 MG PO TABS: 500.0000 mg | ORAL_TABLET | Freq: Two times a day (BID) | ORAL | 0 refills | Status: DC

## 2022-07-27 NOTE — Progress Notes (Signed)
   Acute Office Visit  Subjective:    Patient ID: [name], female    DOB: [birth year], 52 y.o.   MRN: [id number]   HPI 52 y.o. presents today for vaginal discharge x 1 month. No itching or odor. Last sexual encounter was 1-2 months ago.    Review of Systems  Constitutional: Negative.   Genitourinary:  Positive for vaginal discharge. Negative for vaginal pain.       Objective:    Physical Exam Constitutional:      Appearance: Normal appearance.  Genitourinary:    General: Normal vulva.     Vagina: Vaginal discharge and erythema present.     Cervix: Normal.     BP 130/80 (BP Location: Right Arm, Patient Position: Sitting, Cuff Size: Large)   Pulse 78   Wt 222 lb (100.7 kg)   BMI 36.94 kg/m  Wt Readings from Last 3 Encounters:  07/27/22 222 lb (100.7 kg)  02/04/20 210 lb (95.3 kg)  01/29/19 223 lb (101.2 kg)        Patient informed chaperone available to be present for breast and/or pelvic exam. Patient has requested no chaperone to be present. Patient has been advised what will be completed during breast and pelvic exam.   Wet prep + trich + clue cells (+ odor)  Assessment & Plan:   Problem List Items Addressed This Visit   None Visit Diagnoses     Trichomonas infection    -  Primary   Relevant Medications   metroNIDAZOLE (FLAGYL) 500 MG tablet   Vaginal discharge       Relevant Orders   WET PREP FOR [place], YEAST, CLUE   Screening examination for STD (sexually transmitted disease)       Relevant Orders   C. trachomatis/N. gonorrhoeae RNA   WET PREP FOR TRICH, YEAST, CLUE   Bacterial vaginosis       Relevant Medications   metroNIDAZOLE (FLAGYL) 500 MG tablet      Plan: Wet prep positive for trich and clue cells - Flagyl 500 mg BID x 7 days. No intercourse for 7 days after completing treatment, inform partner/s. GC/CT pending. Annual scheduled in April. Will do TOC then.      [place], 3:24 PM 07/27/2022

## 2022-07-28 LAB — C. TRACHOMATIS/N. GONORRHOEAE RNA
C. trachomatis RNA, TMA: NOT DETECTED
N. gonorrhoeae RNA, TMA: NOT DETECTED

## 2022-09-15 ENCOUNTER — Encounter: Payer: Self-pay | Admitting: Nurse Practitioner

## 2022-09-21 ENCOUNTER — Other Ambulatory Visit (HOSPITAL_COMMUNITY)
Admission: RE | Admit: 2022-09-21 | Discharge: 2022-09-21 | Disposition: A | Discharge status: Home / Self Care | Payer: BC Managed Care – PPO | Source: Ambulatory Visit | Attending: Nurse Practitioner | Admitting: Nurse Practitioner

## 2022-09-21 ENCOUNTER — Encounter: Payer: Self-pay | Admitting: Nurse Practitioner

## 2022-09-21 ENCOUNTER — Ambulatory Visit (INDEPENDENT_AMBULATORY_CARE_PROVIDER_SITE_OTHER): Payer: BC Managed Care – PPO | Admitting: Nurse Practitioner

## 2022-09-21 VITALS — BP 118/88 | HR 76 | Ht 65.0 in | Wt 216.0 lb

## 2022-09-21 DIAGNOSIS — Z113 Encounter for screening for infections with a predominantly sexual mode of transmission: Secondary | ICD-10-CM | POA: Diagnosis present

## 2022-09-21 DIAGNOSIS — Z01419 Encounter for gynecological examination (general) (routine) without abnormal findings: Secondary | ICD-10-CM | POA: Diagnosis not present

## 2022-09-21 DIAGNOSIS — N951 Menopausal and female climacteric states: Secondary | ICD-10-CM | POA: Diagnosis not present

## 2022-09-21 DIAGNOSIS — Z124 Encounter for screening for malignant neoplasm of cervix: Secondary | ICD-10-CM | POA: Diagnosis present

## 2022-09-21 NOTE — Progress Notes (Signed)
   [name] December 01, 1970 [id number]   History:  52 y.o. G1P0010 presents for annual exam. Having irregular cycles with mild hot flashes, night sweats and mood changes. LMP 05/2022. Menses very 2-3 months this past year. Normal pap and mammogram history. H/O uterine fibroids, DM (controlled with diet). + trich 07/27/22. - completed full antibiotic course, symptoms resolved, has not been sexually active since.   Gynecologic History Patient's last menstrual period was 05/28/2022 (exact date).   Contraception/Family planning: none Sexually active: Yes  Health Maintenance Last Pap: 01/24/2018. Results were: Normal neg HPV Last mammogram: 12/02/2021. Results were: Normal Last colonoscopy: 1-2 years ago per patient. Results: Polyp Last Dexa: Not indicated  Past medical history, past surgical history, family history and social history were all reviewed and documented in the EPIC chart. Works for [employer].   ROS:  A ROS was performed and pertinent positives and negatives are included.  Exam:  Vitals:   09/21/22 0807  BP: 118/88  Pulse: 76  SpO2: 95%  Weight: 216 lb (98 kg)  Height:  (1.651 m)    Body mass index is 35.94 kg/m.  General appearance:  Normal Thyroid:  Symmetrical, normal in size, without palpable masses or nodularity. Respiratory  Auscultation:  Clear without wheezing or rhonchi Cardiovascular  Auscultation:  Regular rate, without rubs, murmurs or gallops  Edema/varicosities:  Not grossly evident Abdominal  Soft,nontender, without masses, guarding or rebound.  Liver/spleen:  No organomegaly noted  Hernia:  None appreciated  Skin  Inspection:  Grossly normal Genitourinary   Inguinal/mons:  Normal without inguinal adenopathy  External genitalia:  Normal appearing vulva with no masses, tenderness, or lesions  BUS/Urethra/Skene's glands:  Normal  Vagina:  Normal appearing with normal color and discharge, no lesions  Cervix:  Normal appearing without discharge or  lesions  Uterus:  Normal in size, shape and contour.  Midline and mobile, nontender  Adnexa/parametria:     Rt: Normal in size, without masses or tenderness.   Lt: Normal in size, without masses or tenderness.  Anus and perineum: Normal  Digital rectal exam: Deferred  Patient informed chaperone available to be present for breast and pelvic exam. Patient has requested no chaperone to be present. Patient has been advised what will be completed during breast and pelvic exam.   Assessment/Plan:  52 y.o. G1P0010 for annual exam.   Well female exam with routine gynecological exam - Education provided on SBEs, importance of preventative screenings, current guidelines, high calcium diet, regular exercise, and multivitamin daily. Labs done with primary care.   Screening for cervical cancer - Plan: Cytology - PAP( [place]). Normal pap history.   Screening examination for STD (sexually transmitted disease) - Plan: Cytology - PAP( ). Trich added to pap for TOC.   Perimenopause- Having irregular cycles with mild hot flashes, night sweats and mood changes. LMP 05/2022. Menses very 2-3 months this past year. Taking Vit E supplement. Tolerable. Discussed other OTC options.   Screening for breast cancer - Normal mammogram history.  Continue annual screenings.  Normal breast exam today.  Screening for colon cancer - Colonoscopy 1-2 years ago. Will repeat at GI's recommended interval.   Screening for osteoporosis - Average risk. Will plan DXA at age 47.   Follow up in 1 year for annual.         [name], 8:28 AM 09/21/2022

## 2022-09-23 LAB — CYTOLOGY - PAP
Comment: NEGATIVE
Comment: NEGATIVE
Diagnosis: UNDETERMINED — AB
High risk HPV: NEGATIVE
Trichomonas: NEGATIVE

## 2023-02-07 ENCOUNTER — Other Ambulatory Visit: Payer: Self-pay | Admitting: Nurse Practitioner

## 2023-02-07 DIAGNOSIS — Z1231 Encounter for screening mammogram for malignant neoplasm of breast: Secondary | ICD-10-CM

## 2023-02-15 ENCOUNTER — Ambulatory Visit: Payer: BC Managed Care – PPO

## 2023-02-28 ENCOUNTER — Ambulatory Visit
Admission: RE | Admit: 2023-02-28 | Discharge: 2023-02-28 | Disposition: A | Discharge status: Home / Self Care | Payer: BLUE CROSS/BLUE SHIELD | Source: Ambulatory Visit | Attending: Nurse Practitioner

## 2023-02-28 DIAGNOSIS — Z1231 Encounter for screening mammogram for malignant neoplasm of breast: Secondary | ICD-10-CM

## 2023-10-25 ENCOUNTER — Encounter: Payer: Self-pay | Admitting: Nurse Practitioner

## 2023-10-25 ENCOUNTER — Ambulatory Visit (INDEPENDENT_AMBULATORY_CARE_PROVIDER_SITE_OTHER): Payer: BC Managed Care – PPO | Admitting: Nurse Practitioner

## 2023-10-25 VITALS — BP 124/80 | HR 68 | Ht 64.5 in | Wt 213.6 lb

## 2023-10-25 DIAGNOSIS — Z780 Asymptomatic menopausal state: Secondary | ICD-10-CM

## 2023-10-25 DIAGNOSIS — Z01419 Encounter for gynecological examination (general) (routine) without abnormal findings: Secondary | ICD-10-CM | POA: Diagnosis not present

## 2023-10-25 DIAGNOSIS — Z1331 Encounter for screening for depression: Secondary | ICD-10-CM

## 2023-10-25 NOTE — Progress Notes (Signed)
 [name] April 11, 1971 [id number]   History:  53 y.o. G1P0010 presents for annual exam. Postmenopausal. LMP December 2023. Having some mild hot flashes and night sweats. Normal pap history. H/O uterine fibroids, DM (controlled with diet).  Gynecologic History No LMP recorded. Patient is postmenopausal.   Contraception/Family planning: none Sexually active: Yes  Health Maintenance Last Pap: 09/21/2022. Results were: ASCUS neg HPV, 3-year repeat Last mammogram: 12/02/2021. Results were: Normal Last colonoscopy: 2022 per patient. Results: Polyp Last Dexa: Not indicated     10/25/2023    7:47 AM  Depression screen PHQ 2/9  Decreased Interest 0  Down, Depressed, Hopeless 0  PHQ - 2 Score 0     Past medical history, past surgical history, family history and social history were all reviewed and documented in the EPIC chart. Works for [employer]. Travels to [place] often.   ROS:  A ROS was performed and pertinent positives and negatives are included.  Exam:  Vitals:   10/25/23 0747  BP: 124/80  Pulse: 68  SpO2: 97%  Weight: 213 lb 9.6 oz (96.9 kg)  Height: 5' 4.5" (1.638 m)     Body mass index is 36.1 kg/m.  General appearance:  Normal Thyroid:  Symmetrical, normal in size, without palpable masses or nodularity. Respiratory  Auscultation:  Clear without wheezing or rhonchi Cardiovascular  Auscultation:  Regular rate, without rubs, murmurs or gallops  Edema/varicosities:  Not grossly evident Abdominal  Soft,nontender, without masses, guarding or rebound.  Liver/spleen:  No organomegaly noted  Hernia:  None appreciated  Skin  Inspection:  Grossly normal Breasts: Examined lying and sitting.   Right: Without masses, retractions, nipple discharge or axillary adenopathy.   Left: Without masses, retractions, nipple discharge or axillary adenopathy. Pelvic: External genitalia:  no lesions              Urethra:  normal appearing urethra with no masses, tenderness or  lesions              Bartholins and Skenes: normal                 Vagina: normal appearing vagina with normal color and discharge, no lesions              Cervix: no lesions Bimanual Exam:  Uterus:  no masses or tenderness              Adnexa: no mass, fullness, tenderness              Rectovaginal: Deferred              Anus:  normal, no lesions  Patient informed chaperone available to be present for breast and pelvic exam. Patient has requested no chaperone to be present. Patient has been advised what will be completed during breast and pelvic exam.   Assessment/Plan:  53 y.o. G1P0010 for annual exam.   Well female exam with routine gynecological exam - Education provided on SBEs, importance of preventative screenings, current guidelines, high calcium diet, regular exercise, and multivitamin daily. Labs done with PCP.   Screening for cervical cancer - 09/2022 ASCUS neg HPV, otherwise normal pap history. Will repeat at 3-year interval per guidelines.   Postmenopausal - LMP 05/2022. Mild hot flashes and night sweats. OTC supplements recommended.   Screening for breast cancer - Normal mammogram history. Overdue and encouraged to schedule. Normal breast exam today.  Screening for colon cancer - Colonoscopy 2022. Will repeat at GI's recommended interval.   Screening for osteoporosis -  Average risk. Will plan DXA at age 16.   Return in about 1 year (around 10/24/2024) for Annual.        Andee Bamberger [facility], 8:07 AM 10/25/2023

## 2024-11-04 ENCOUNTER — Ambulatory Visit: Admitting: Nurse Practitioner
